# Patient Record
Sex: Female | Born: 1975 | Race: Black or African American | Hispanic: No | Marital: Single | Smoking: Never smoker
Health system: Southern US, Community
[De-identification: ages and names within clinical notes are randomized; demographics above are authoritative.]

## PROBLEM LIST (undated history)

## (undated) DIAGNOSIS — M1990 Unspecified osteoarthritis, unspecified site: Secondary | ICD-10-CM

## (undated) DIAGNOSIS — I10 Essential (primary) hypertension: Secondary | ICD-10-CM

## (undated) DIAGNOSIS — K219 Gastro-esophageal reflux disease without esophagitis: Secondary | ICD-10-CM

---

## 1997-04-16 ENCOUNTER — Inpatient Hospital Stay (HOSPITAL_COMMUNITY): Admission: AD | Admit: 1997-04-16 | Discharge: 1997-04-16 | Payer: Self-pay | Admitting: *Deleted

## 1997-07-28 ENCOUNTER — Ambulatory Visit (HOSPITAL_COMMUNITY): Admission: RE | Admit: 1997-07-28 | Discharge: 1997-07-28 | Payer: Self-pay | Admitting: *Deleted

## 1997-08-22 ENCOUNTER — Inpatient Hospital Stay (HOSPITAL_COMMUNITY): Admission: AD | Admit: 1997-08-22 | Discharge: 1997-08-22 | Payer: Self-pay | Admitting: Obstetrics

## 1997-11-09 ENCOUNTER — Inpatient Hospital Stay (HOSPITAL_COMMUNITY): Admission: AD | Admit: 1997-11-09 | Discharge: 1997-11-09 | Payer: Self-pay | Admitting: *Deleted

## 1997-11-20 ENCOUNTER — Inpatient Hospital Stay (HOSPITAL_COMMUNITY): Admission: AD | Admit: 1997-11-20 | Discharge: 1997-11-24 | Payer: Self-pay | Admitting: Obstetrics & Gynecology

## 1998-06-04 ENCOUNTER — Emergency Department (HOSPITAL_COMMUNITY): Admission: EM | Admit: 1998-06-04 | Discharge: 1998-06-04 | Payer: Self-pay | Admitting: Emergency Medicine

## 1998-06-05 ENCOUNTER — Encounter: Payer: Self-pay | Admitting: Emergency Medicine

## 1998-06-15 ENCOUNTER — Emergency Department (HOSPITAL_COMMUNITY): Admission: EM | Admit: 1998-06-15 | Discharge: 1998-06-15 | Payer: Self-pay | Admitting: Emergency Medicine

## 1999-12-01 ENCOUNTER — Emergency Department (HOSPITAL_COMMUNITY): Admission: EM | Admit: 1999-12-01 | Discharge: 1999-12-01 | Payer: Self-pay | Admitting: *Deleted

## 2000-09-13 ENCOUNTER — Encounter (INDEPENDENT_AMBULATORY_CARE_PROVIDER_SITE_OTHER): Payer: Self-pay

## 2000-09-13 ENCOUNTER — Inpatient Hospital Stay (HOSPITAL_COMMUNITY): Admission: AD | Admit: 2000-09-13 | Discharge: 2000-09-22 | Payer: Self-pay | Admitting: Obstetrics

## 2000-09-13 ENCOUNTER — Encounter: Payer: Self-pay | Admitting: Obstetrics

## 2000-09-17 ENCOUNTER — Encounter: Payer: Self-pay | Admitting: Obstetrics

## 2000-09-17 ENCOUNTER — Encounter: Payer: Self-pay | Admitting: Obstetrics & Gynecology

## 2000-09-24 ENCOUNTER — Inpatient Hospital Stay (HOSPITAL_COMMUNITY): Admission: AD | Admit: 2000-09-24 | Discharge: 2000-09-24 | Payer: Self-pay | Admitting: Obstetrics & Gynecology

## 2000-09-29 ENCOUNTER — Inpatient Hospital Stay (HOSPITAL_COMMUNITY): Admission: AD | Admit: 2000-09-29 | Discharge: 2000-09-29 | Payer: Self-pay | Admitting: *Deleted

## 2000-10-14 ENCOUNTER — Encounter: Admission: RE | Admit: 2000-10-14 | Discharge: 2000-10-14 | Payer: Self-pay | Admitting: Family Medicine

## 2000-11-05 ENCOUNTER — Encounter: Admission: RE | Admit: 2000-11-05 | Discharge: 2000-11-05 | Payer: Self-pay | Admitting: Family Medicine

## 2000-11-18 ENCOUNTER — Encounter: Admission: RE | Admit: 2000-11-18 | Discharge: 2000-11-18 | Payer: Self-pay | Admitting: Family Medicine

## 2003-04-13 ENCOUNTER — Emergency Department (HOSPITAL_COMMUNITY): Admission: EM | Admit: 2003-04-13 | Discharge: 2003-04-13 | Payer: Self-pay | Admitting: Emergency Medicine

## 2003-06-08 ENCOUNTER — Encounter: Admission: RE | Admit: 2003-06-08 | Discharge: 2003-06-08 | Payer: Self-pay | Admitting: Obstetrics and Gynecology

## 2003-08-29 ENCOUNTER — Encounter: Admission: RE | Admit: 2003-08-29 | Discharge: 2003-08-29 | Payer: Self-pay | Admitting: Obstetrics and Gynecology

## 2003-09-18 ENCOUNTER — Ambulatory Visit (HOSPITAL_COMMUNITY): Admission: RE | Admit: 2003-09-18 | Discharge: 2003-09-18 | Payer: Self-pay | Admitting: Obstetrics and Gynecology

## 2003-09-18 ENCOUNTER — Encounter (INDEPENDENT_AMBULATORY_CARE_PROVIDER_SITE_OTHER): Payer: Self-pay | Admitting: Specialist

## 2003-10-12 ENCOUNTER — Ambulatory Visit: Payer: Self-pay | Admitting: Obstetrics and Gynecology

## 2004-05-15 ENCOUNTER — Ambulatory Visit: Payer: Self-pay | Admitting: Internal Medicine

## 2004-08-01 ENCOUNTER — Encounter (INDEPENDENT_AMBULATORY_CARE_PROVIDER_SITE_OTHER): Payer: Self-pay | Admitting: *Deleted

## 2004-08-19 ENCOUNTER — Ambulatory Visit: Payer: Self-pay | Admitting: Family Medicine

## 2004-08-20 ENCOUNTER — Ambulatory Visit: Payer: Self-pay | Admitting: *Deleted

## 2005-07-04 ENCOUNTER — Ambulatory Visit: Payer: Self-pay | Admitting: Nurse Practitioner

## 2006-03-27 ENCOUNTER — Encounter (INDEPENDENT_AMBULATORY_CARE_PROVIDER_SITE_OTHER): Payer: Self-pay | Admitting: *Deleted

## 2007-02-24 ENCOUNTER — Encounter: Payer: Self-pay | Admitting: Obstetrics and Gynecology

## 2007-02-24 ENCOUNTER — Ambulatory Visit: Payer: Self-pay | Admitting: Obstetrics and Gynecology

## 2007-05-26 ENCOUNTER — Ambulatory Visit (HOSPITAL_COMMUNITY): Admission: RE | Admit: 2007-05-26 | Discharge: 2007-05-26 | Payer: Self-pay | Admitting: Family Medicine

## 2007-05-28 ENCOUNTER — Ambulatory Visit: Payer: Self-pay | Admitting: Obstetrics & Gynecology

## 2007-06-10 ENCOUNTER — Ambulatory Visit: Payer: Self-pay | Admitting: Obstetrics & Gynecology

## 2007-06-11 ENCOUNTER — Ambulatory Visit: Payer: Self-pay | Admitting: Obstetrics and Gynecology

## 2007-06-22 ENCOUNTER — Ambulatory Visit (HOSPITAL_COMMUNITY): Admission: RE | Admit: 2007-06-22 | Discharge: 2007-06-22 | Payer: Self-pay | Admitting: Family Medicine

## 2007-07-01 ENCOUNTER — Ambulatory Visit: Payer: Self-pay | Admitting: *Deleted

## 2007-07-07 ENCOUNTER — Ambulatory Visit (HOSPITAL_COMMUNITY): Admission: RE | Admit: 2007-07-07 | Discharge: 2007-07-07 | Payer: Self-pay | Admitting: Family Medicine

## 2007-07-29 ENCOUNTER — Ambulatory Visit: Payer: Self-pay | Admitting: Obstetrics & Gynecology

## 2007-08-04 ENCOUNTER — Ambulatory Visit (HOSPITAL_COMMUNITY): Admission: RE | Admit: 2007-08-04 | Discharge: 2007-08-04 | Payer: Self-pay | Admitting: Family Medicine

## 2007-08-12 ENCOUNTER — Ambulatory Visit: Payer: Self-pay | Admitting: Obstetrics & Gynecology

## 2007-08-16 ENCOUNTER — Ambulatory Visit: Payer: Self-pay | Admitting: Obstetrics & Gynecology

## 2007-08-26 ENCOUNTER — Ambulatory Visit: Payer: Self-pay | Admitting: Obstetrics & Gynecology

## 2007-09-09 ENCOUNTER — Ambulatory Visit: Payer: Self-pay | Admitting: Family Medicine

## 2007-09-15 ENCOUNTER — Ambulatory Visit (HOSPITAL_COMMUNITY): Admission: RE | Admit: 2007-09-15 | Discharge: 2007-09-15 | Payer: Self-pay | Admitting: Family Medicine

## 2007-09-30 ENCOUNTER — Ambulatory Visit: Payer: Self-pay | Admitting: Obstetrics & Gynecology

## 2007-09-30 ENCOUNTER — Inpatient Hospital Stay (HOSPITAL_COMMUNITY): Admission: AD | Admit: 2007-09-30 | Discharge: 2007-09-30 | Payer: Self-pay | Admitting: Obstetrics & Gynecology

## 2007-10-07 ENCOUNTER — Ambulatory Visit: Payer: Self-pay | Admitting: Obstetrics & Gynecology

## 2007-10-11 ENCOUNTER — Ambulatory Visit: Payer: Self-pay | Admitting: Gynecology

## 2007-10-12 ENCOUNTER — Encounter: Payer: Self-pay | Admitting: *Deleted

## 2007-10-12 ENCOUNTER — Inpatient Hospital Stay (HOSPITAL_COMMUNITY): Admission: AD | Admit: 2007-10-12 | Discharge: 2007-10-12 | Payer: Self-pay | Admitting: Gynecology

## 2007-10-12 ENCOUNTER — Ambulatory Visit: Payer: Self-pay | Admitting: Family Medicine

## 2007-10-14 ENCOUNTER — Ambulatory Visit: Payer: Self-pay | Admitting: Obstetrics & Gynecology

## 2007-10-18 ENCOUNTER — Ambulatory Visit: Payer: Self-pay | Admitting: Family Medicine

## 2007-10-21 ENCOUNTER — Ambulatory Visit: Payer: Self-pay | Admitting: Obstetrics & Gynecology

## 2007-10-25 ENCOUNTER — Ambulatory Visit: Payer: Self-pay | Admitting: Gynecology

## 2007-10-28 ENCOUNTER — Ambulatory Visit: Payer: Self-pay | Admitting: Obstetrics & Gynecology

## 2007-11-01 ENCOUNTER — Ambulatory Visit: Payer: Self-pay | Admitting: Obstetrics & Gynecology

## 2007-11-01 ENCOUNTER — Ambulatory Visit (HOSPITAL_COMMUNITY): Admission: RE | Admit: 2007-11-01 | Discharge: 2007-11-01 | Payer: Self-pay | Admitting: Family Medicine

## 2007-11-04 ENCOUNTER — Ambulatory Visit: Payer: Self-pay | Admitting: Obstetrics & Gynecology

## 2007-11-08 ENCOUNTER — Ambulatory Visit: Payer: Self-pay | Admitting: Obstetrics & Gynecology

## 2007-11-11 ENCOUNTER — Ambulatory Visit: Payer: Self-pay | Admitting: Obstetrics & Gynecology

## 2007-11-12 ENCOUNTER — Ambulatory Visit: Payer: Self-pay | Admitting: Family Medicine

## 2007-11-12 ENCOUNTER — Inpatient Hospital Stay (HOSPITAL_COMMUNITY): Admission: RE | Admit: 2007-11-12 | Discharge: 2007-11-15 | Payer: Self-pay | Admitting: Family Medicine

## 2007-11-12 ENCOUNTER — Encounter: Payer: Self-pay | Admitting: Family Medicine

## 2007-11-27 ENCOUNTER — Inpatient Hospital Stay (HOSPITAL_COMMUNITY): Admission: AD | Admit: 2007-11-27 | Discharge: 2007-11-27 | Payer: Self-pay | Admitting: Family Medicine

## 2008-07-26 ENCOUNTER — Emergency Department (HOSPITAL_COMMUNITY): Admission: EM | Admit: 2008-07-26 | Discharge: 2008-07-26 | Payer: Self-pay | Admitting: Emergency Medicine

## 2009-03-30 ENCOUNTER — Emergency Department (HOSPITAL_COMMUNITY): Admission: EM | Admit: 2009-03-30 | Discharge: 2009-03-31 | Payer: Self-pay | Admitting: Emergency Medicine

## 2009-03-31 ENCOUNTER — Emergency Department (HOSPITAL_COMMUNITY): Admission: EM | Admit: 2009-03-31 | Discharge: 2009-03-31 | Payer: Self-pay | Admitting: Emergency Medicine

## 2009-12-04 IMAGING — US US OB DETAIL+14 WK
1 series · 18 of 28 positions shown · non-contrast
Comparison: none

OBSTETRICAL ULTRASOUND:
 This ultrasound was performed in The [HOSPITAL], and the [facility] report will be stored to [REDACTED] PACS.

[Series 1: us ob detail+14 wk · 89 acquisitions, 18 frames shown]
[im 1/89]
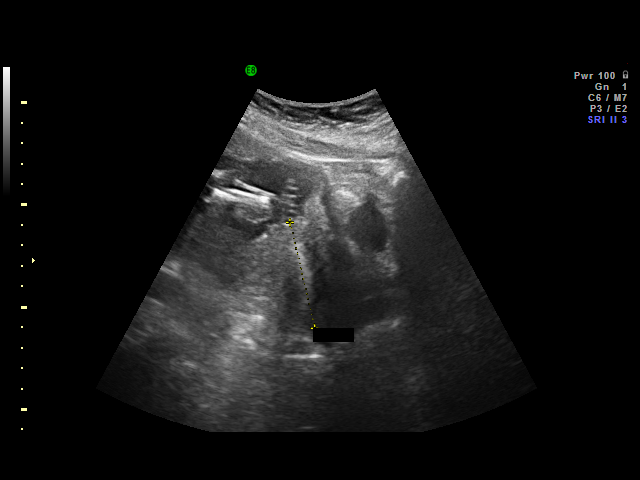
[im 7/89]
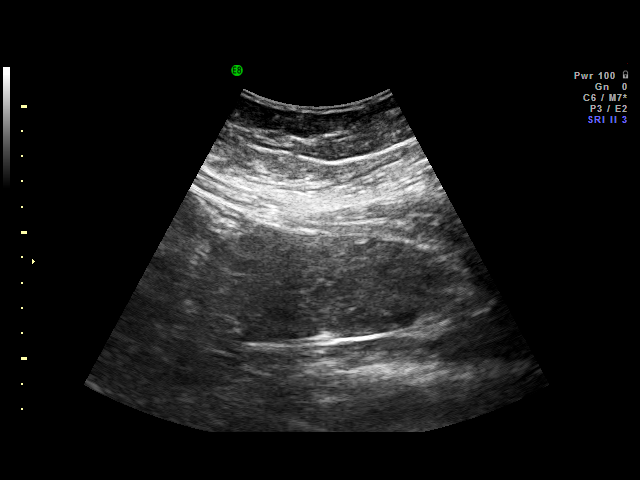
[im 10/89]
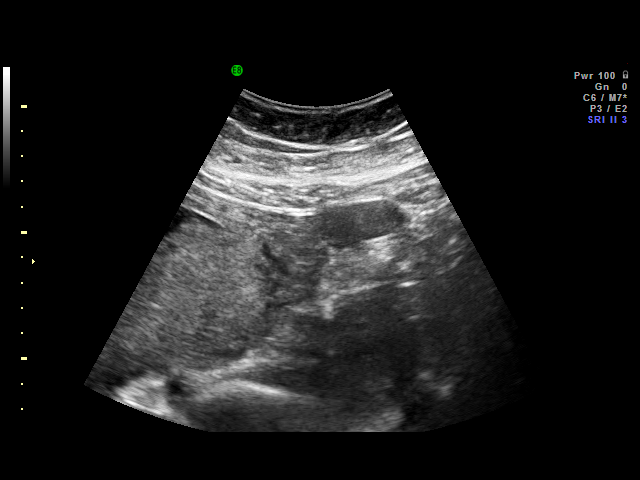
[im 17/89]
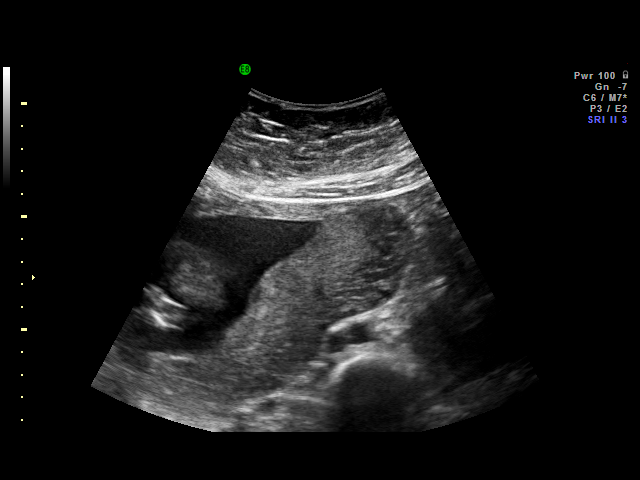
[im 23/89]
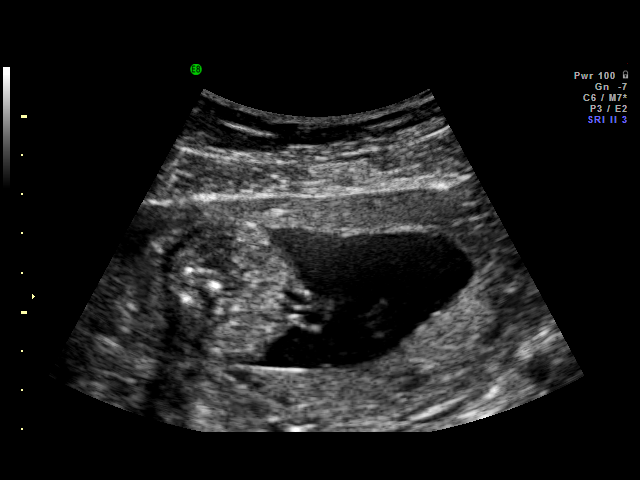
[im 27/89]
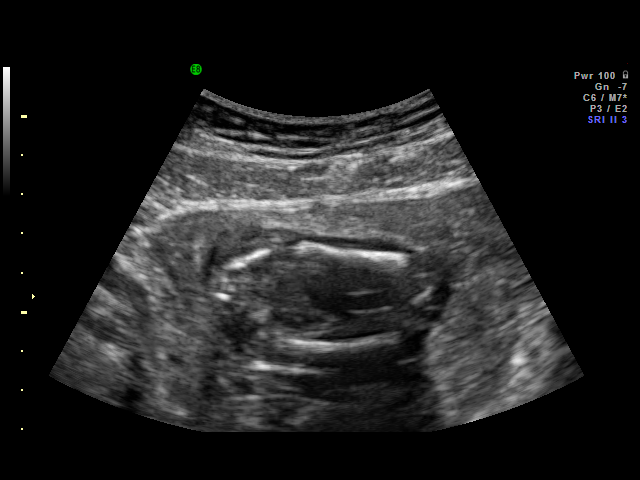
[im 33/89]
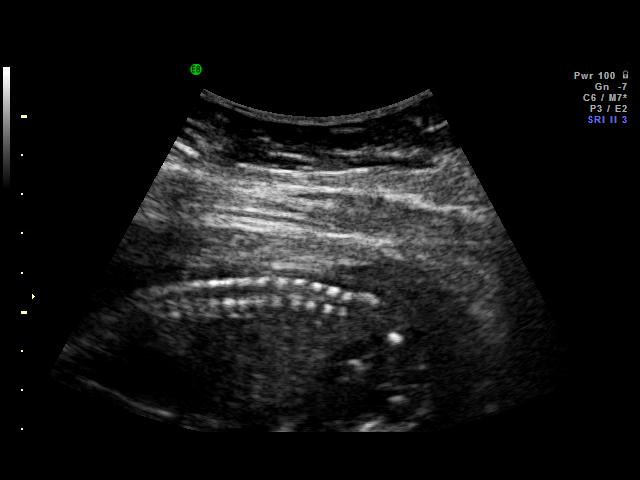
[im 36/89]
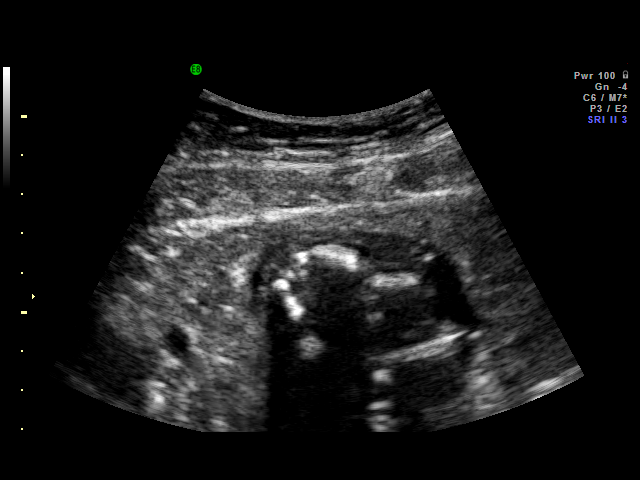
[im 43/89]
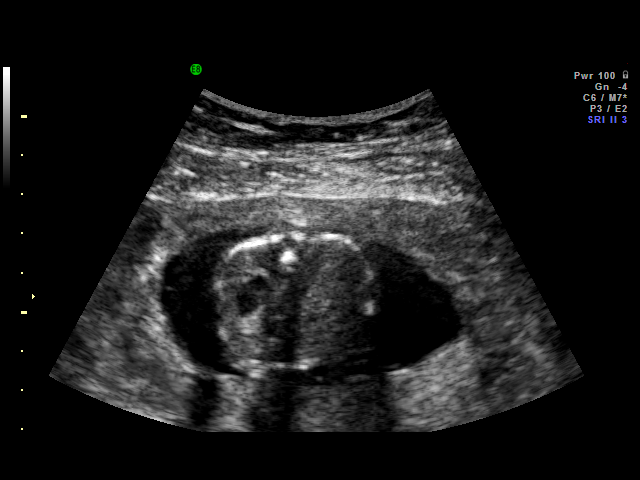
[im 46/89]
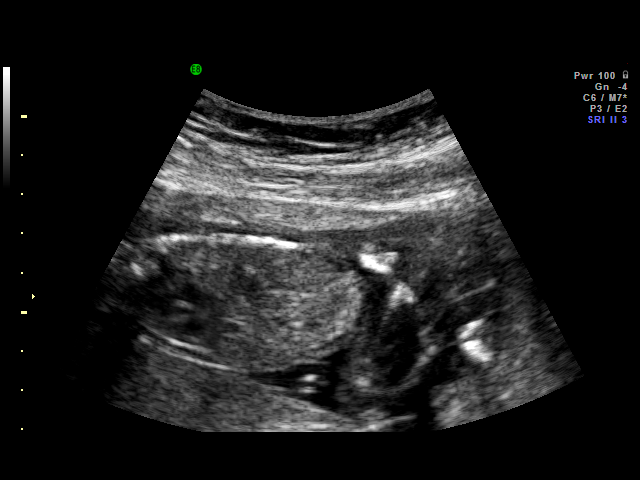
[im 53/89]
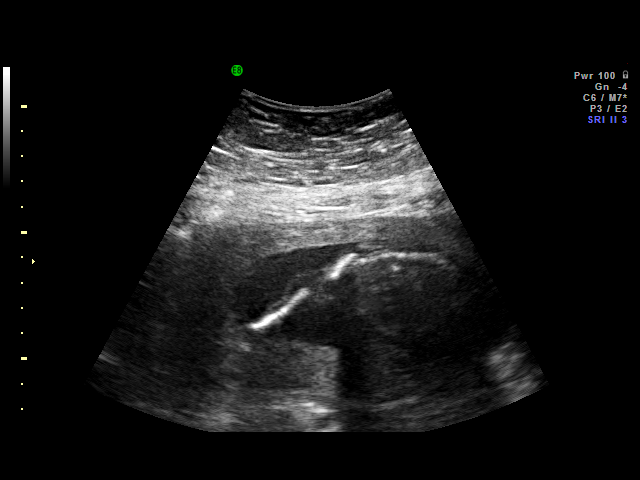
[im 56/89]
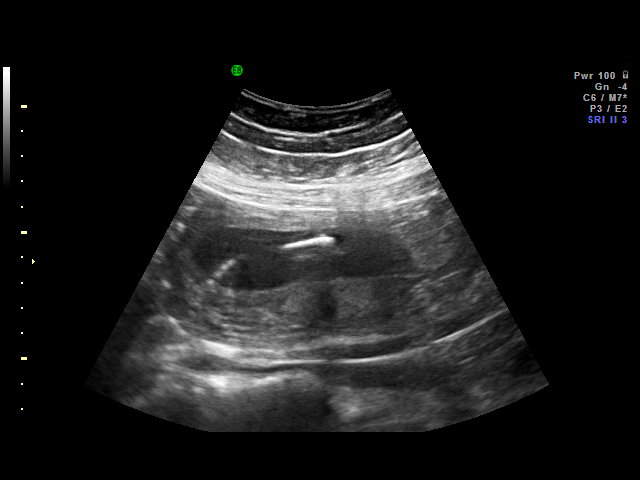
[im 62/89]
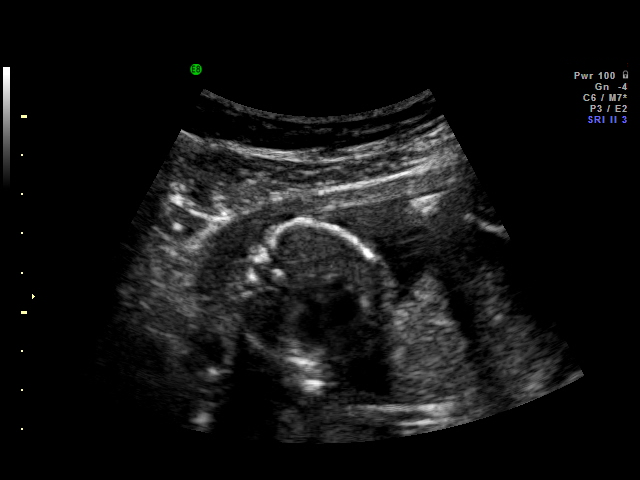
[im 69/89]
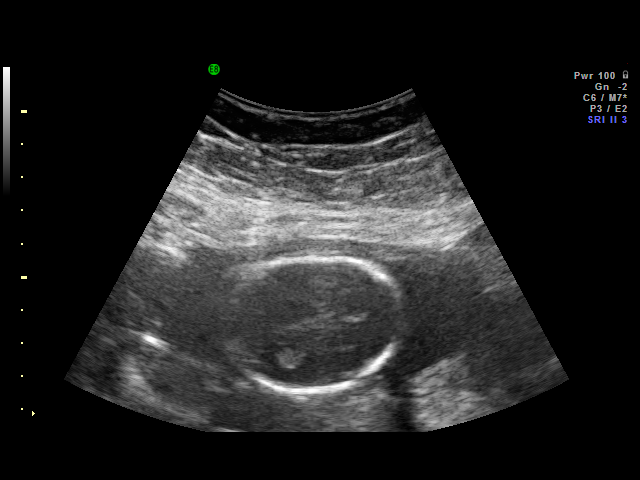
[im 72/89]
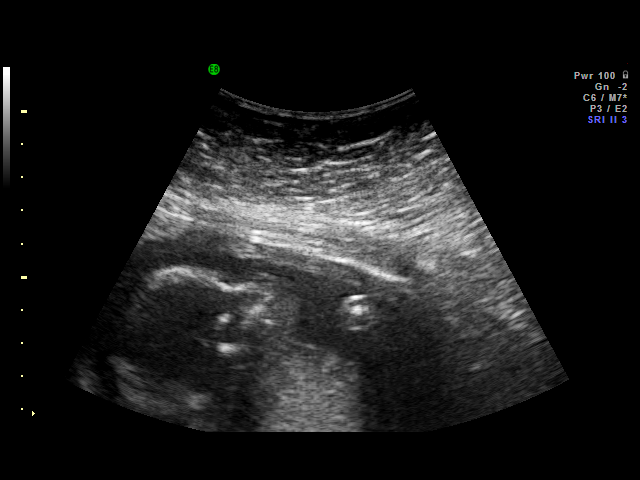
[im 79/89]
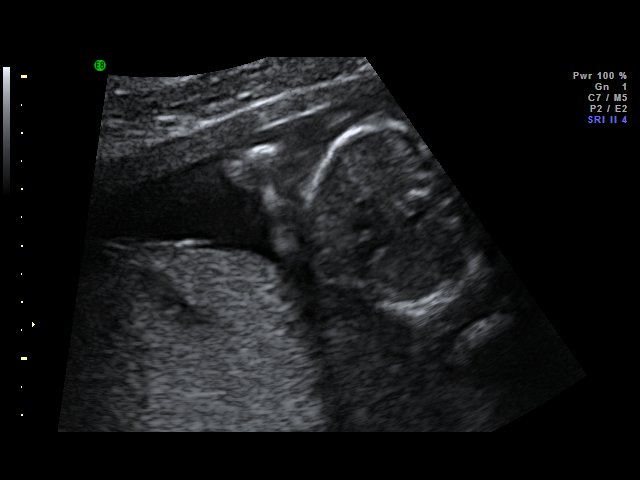
[im 82/89]
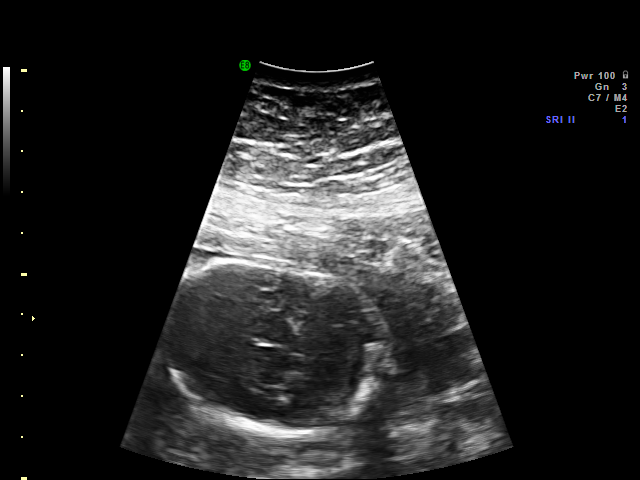
[im 89/89]
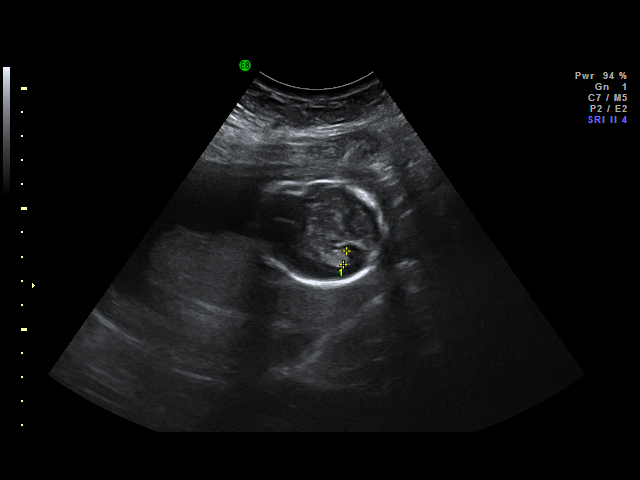

[18 of 28 positions shown; findings below may reference images not displayed]

IMPRESSION: [facility] has also been faxed to the ordering physician.

## 2010-02-17 ENCOUNTER — Encounter: Payer: Self-pay | Admitting: *Deleted

## 2010-03-11 IMAGING — US US OB FOLLOW-UP
1 series · 14 of 26 positions shown · non-contrast
Comparison: none

OBSTETRICAL ULTRASOUND:
 This ultrasound was performed in The [facility]OSP[facility]], and the [facility] report will be stored to [REDACTED] PACS.

[Series 1: us ob follow-up · 14 of 26 slices shown]
[im 1/26]
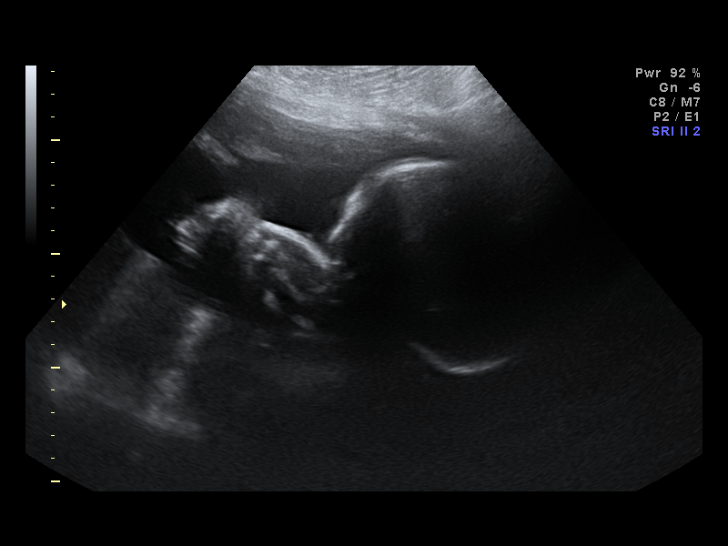
[im 3/26]
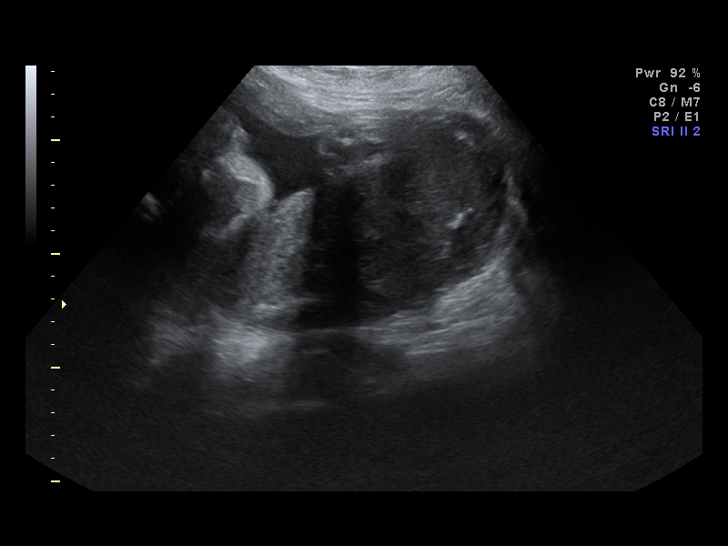
[im 5/26]
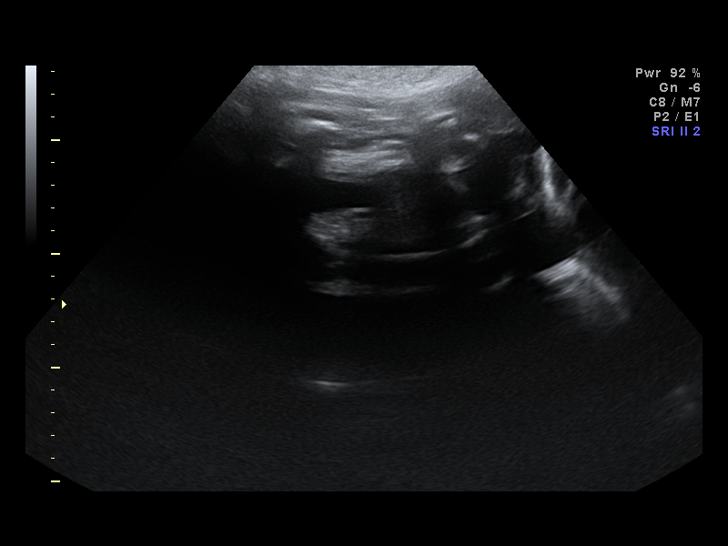
[im 7/26]
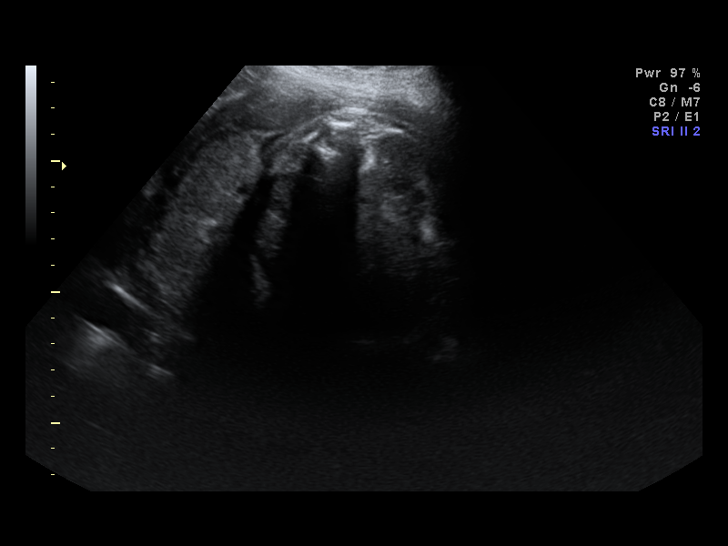
[im 9/26]
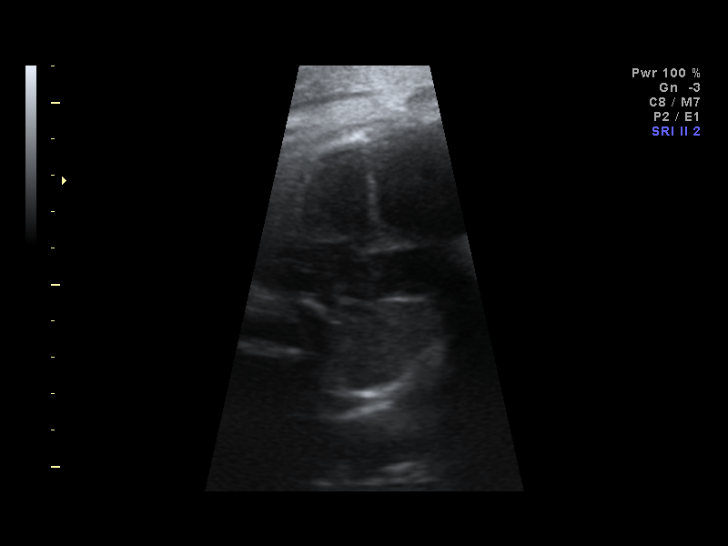
[im 11/26]
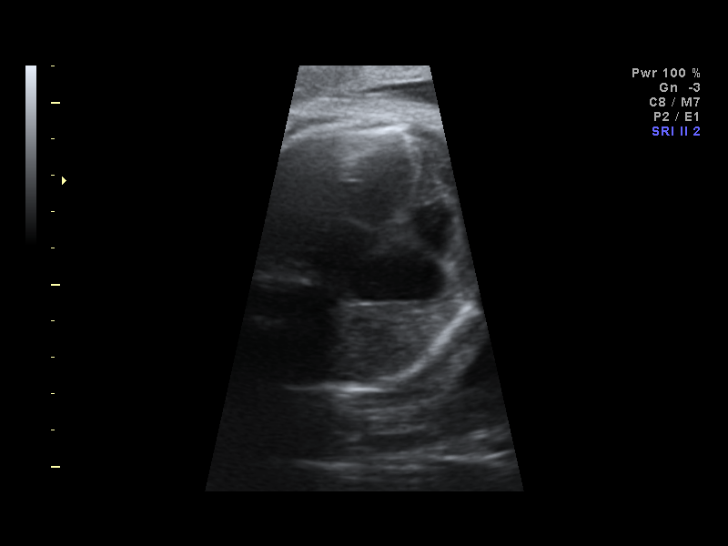
[im 13/26]
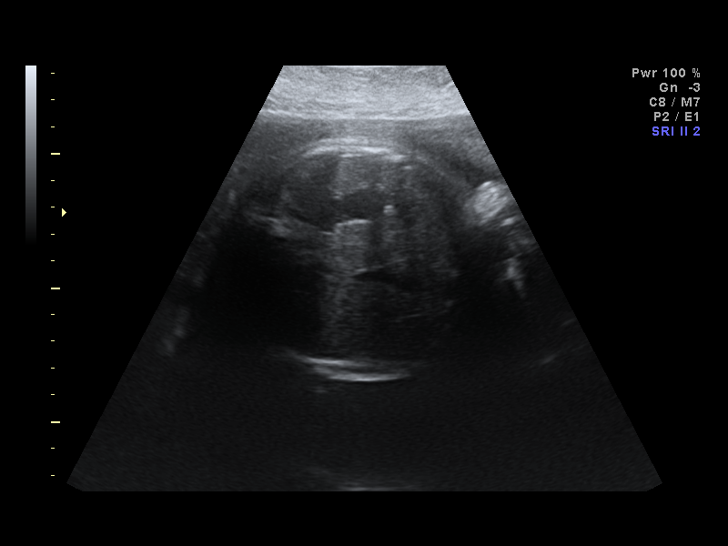
[im 14/26]
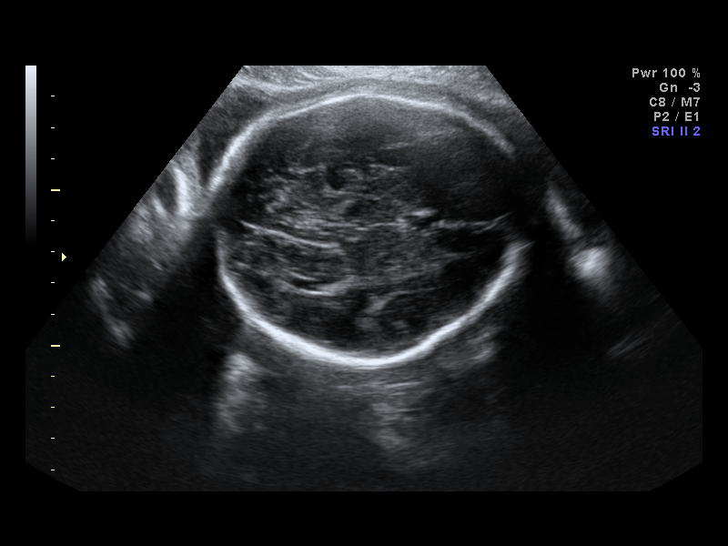
[im 16/26]
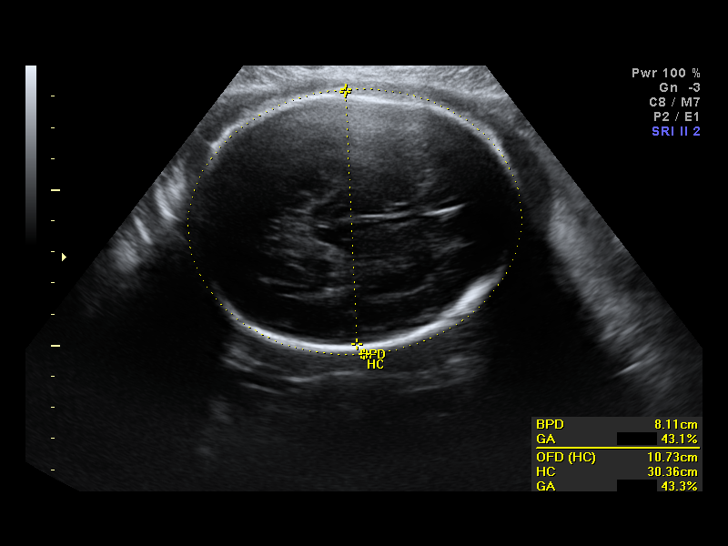
[im 18/26]
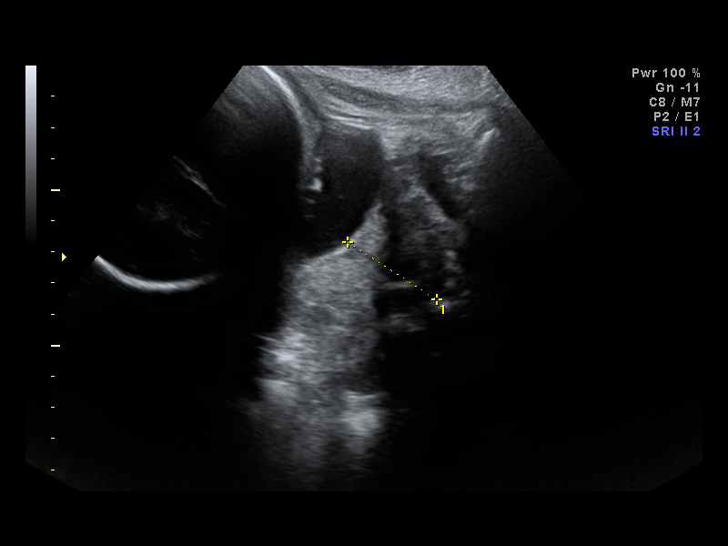
[im 20/26]
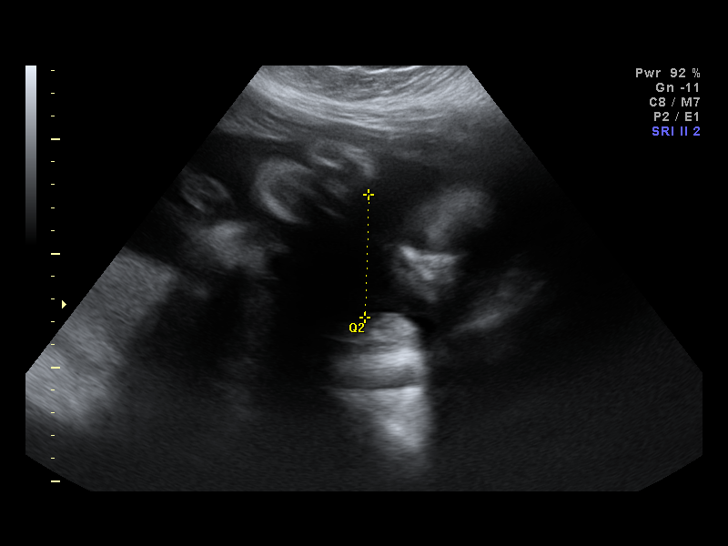
[im 22/26]
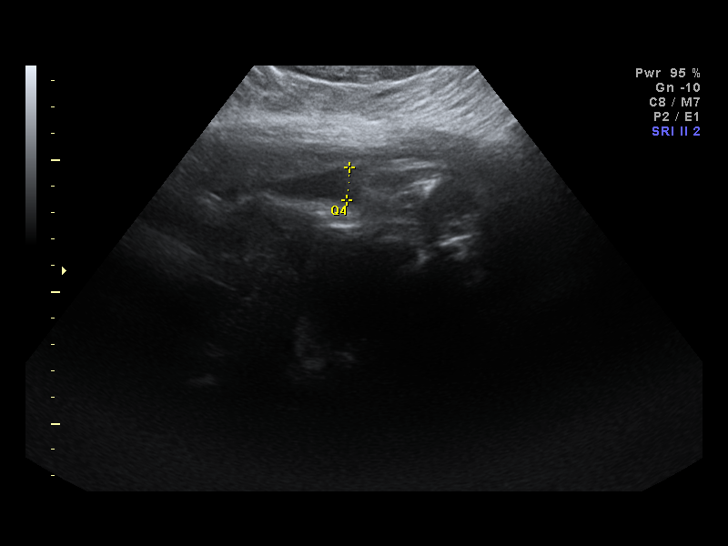
[im 24/26]
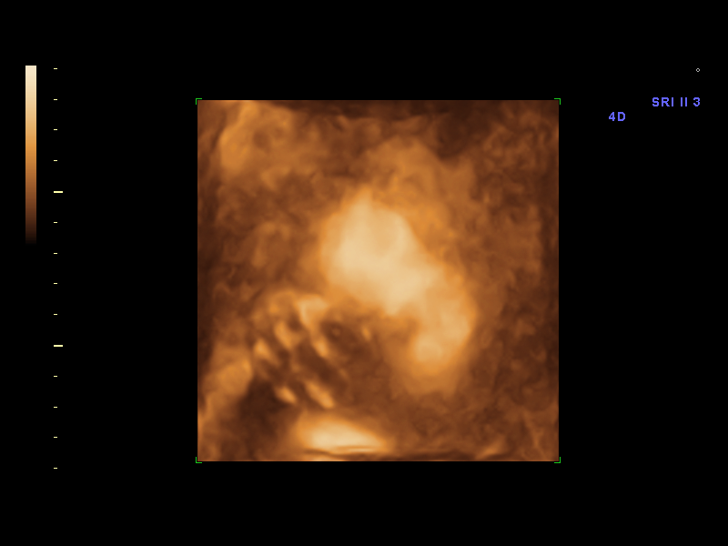
[im 26/26]
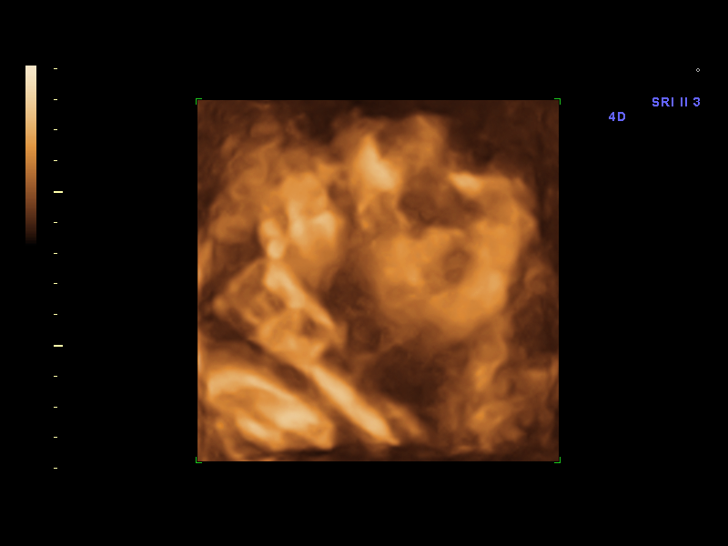

[14 of 26 positions shown; findings below may reference images not displayed]

IMPRESSION: [facility] has also been faxed to the ordering physician.

## 2010-06-11 NOTE — Op Note (Signed)
NAME:  Courtney Cortez, Courtney Cortez                 ACCOUNT NO.:  [id number]   MEDICAL RECORD NO.:  [id number]          PATIENT TYPE:  INP   LOCATION:  9372                          FACILITY:  [facility]   PHYSICIAN:  Tanya S. Shawnie Pons, M.D.   DATE OF BIRTH:  1975-03-28   DATE OF PROCEDURE:  11/12/2007  DATE OF DISCHARGE:                               OPERATIVE REPORT   PREOPERATIVE DIAGNOSES:  Intrauterine pregnancy at 37 weeks, previous  cesarean section x2, and chronic hypertension with superimposed  preeclampsia.   DISCHARGE DIAGNOSES:  Intrauterine pregnancy at 37 weeks, previous  cesarean section x2, and chronic hypertension with superimposed pre-  eclampsia.   PROCEDURE:  Repeat low transverse cesarean section.   SURGEON:  Shelbie Proctor. Shawnie Pons, MD   ASSISTANT:  Javier Glazier. Okey Dupre, MD   ANESTHESIA:  Spinal, local, Raul Del, MD.   FINDINGS:  Viable female infant, Apgars 8 and 9, weight 6 pounds 12  ounces.   SPECIMENS:  Placenta to pathology.   ESTIMATED BLOOD LOSS:  1000 mL.   COMPLICATIONS:  None known.   REASON FOR PROCEDURE:  Briefly, the patient is a 35 year old, gravida 4,  para 2-1-0-3, who has a history of chronic hypertension.  She has been  followed in our clinic and had a baseline 24-hour urine of 206 mg.  On  October 18, 2007, she was up to 610 mg by urine dip with elevated  blood pressures.  Her case was reviewed MSN who agreed with the delivery  at 37 weeks.  The patient had previously VBAC x1 and then had a  subsequent C-section and desired C-section for this pregnancy.   PROCEDURE:  The patient was taken to the OR where she was prepped and  draped in the usual sterile fashion.  A Foley catheter was placed inside  her bladder.  When the anesthesia was felt to be adequate, I proceeded  to make a Pfannenstiel incision through the old scar.  This incision  carried down to the underlying fascia with electrocautery used to  control bleeding.  The fascia was divided in the midline,  and the  fascial incision extended laterally sharply.  Edges of the superior and  inferior portions of the fascia were grasped with Kocher clamps and  dissected off the underlying rectus muscle bluntly laterally and with  the cautery in the midline.  Attempt was made to divide the rectus  muscles; however, these were densely adherent to each other.  The  peritoneal cavity was finally entered high with a strong band of  peritoneum that was densely adherent to the underlying rectus and had to  be taken down with sharp dissection laterally on the inferior portion of  the incision.  A bladder flap was then created.  The bladder was not  exceptionally high on the uterus, but the lower uterine segment was  relatively thick, but an adequate uterine incision was made in the lower  uterine segment.  It was carried down to the underlying amniotic cavity  in the midline, and then, the incision extended bluntly laterally.  The  AROM was  accomplished with an Allis clamp, and clear fluid was noted.  The infant was in a vertex presentation; however, it was occiput  transverse.  Attempt was made to deliver the infant; however, could not  be delivered even by vacuum until the rectus was taken down on the  patient's right side.  After this, the baby was delivered easily.  There  was a spontaneous cry on the abdomen.  The cord was clamped x2, cut, and  the infant given to awaiting peds.  Placenta was delivered after this  and given to cord blood donation.  The uterine cavity was cleaned out  with a dry lap pad.  Edges of the uterine incision were grasped with  ring forceps.  The uterine incision closed with a 0 Vicryl suture in a  locked running fashion.  A figure-of-eight was used at the patient's  left corner of the uterine incision to achieve hemostasis.  The uterine  incision was looked at twice by myself and Dr. Okey Dupre and felt to be  adequately hemostatic.  Peritoneum was grasped with Kelly clamps and   closed with a 0 Vicryl suture in a running fashion.  The fascia was  closed with 0 Vicryl also running.  Subcutaneous tissue was irrigated,  any bleeders were cauterized, and the skin closed using clips.  A 30 mL  of 0.25% of Marcaine were injected about the incision.  All instrument  and lap counts were correct x2.  The patient was awakened and taken to  recovery room in stable condition.       Shelbie Proctor. Shawnie Pons, M.D.  Electronically Signed     TSP/MEDQ  D:  11/12/2007  T:  11/13/2007  Job:  [id number]

## 2010-06-11 NOTE — Group Therapy Note (Signed)
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 Has been getting regular Pap  smears.  Last Pap smear she got apparently they got no endocervical  cells although the rest of the Pap smear was negative.  We repeated the  Pap smear today with endocervical brushings which I expect should be  adequate.  I told the patient if her Pap smear is normal this time, she  would not have to come back for 1 year.           ______________________________  Argentina Donovan, MD     PR/MEDQ  D:  02/24/2007  T:  02/25/2007  Job:  [id number]

## 2010-06-11 NOTE — Discharge Summary (Signed)
NAME:  Courtney Cortez, Courtney Cortez                 ACCOUNT NO.:  [id number]   MEDICAL RECORD NO.:  [id number]          PATIENT TYPE:  INP   LOCATION:  9127                          [facility]ACILITY:  [facility]   PHYSICIAN:  Allie Bossier, MD        DATE OF BIRTH:  07-22-1975   DATE OF ADMISSION:  11/12/2007  DATE OF DISCHARGE:  11/15/2007                               DISCHARGE SUMMARY   REASON FOR ADMISSION:  Ms. Courtney Cortez is a 35 year old gravida 4, para  2-1-0-3 who was admitted at 59 weeks' gestational age diagnosis of mild  preeclampsia for an elective repeat low transverse cesarean section.  The patient refused VBAC and tubal ligation procedures.  The patient was  counseled on the risks and benefits of repeat cesarean section to  include but not limited to bleeding, infection and damage to intra-  abdominal organs.  The patient understands these risks and desires to  proceed with low-transverse cesarean section.   HOSPITAL COURSE:  The patient was admitted and a repeat low transverse  cesarean section was performed.  For details of procedure, please see  Dr. Bertram Denver dictation.  Postoperatively, the patient was admitted to  the Intensive Care Unit and received magnesium sulfate for 24 hours.  Upon completion of her 24 hours magnesium sulfate, she was transferred  to the ward where her postpartum course remained unremarkable.  Her  blood pressures during her postpartum course were in the range of 130s-  140s/80s-90s.  Her postpartum hemoglobin and hematocrit was 8.7 and  26.3.  This is a decrease from 10.6 and 32.8 upon admission.  At the  time of discharge, she is in good condition with minimal pain.  She is  ambulating well, tolerating regular diet and passing flatus.  She will  be discharged to home.  She will have her staples removed by the baby  lab nurse between postoperative day #5 and 7.  She will resume her  prepregnancy blood pressure medications which is hydrochlorothiazide 25  mg daily.  She is  also given ibuprofen and Percocet for pain.  She is  instructed to [facility] weeks as well as  return to her primary care Courtney Cortez for reevaluation of her blood  pressure control.   FINAL DIAGNOSES:  1. Term intrauterine pregnancy.  2. Mild preeclampsia.  3. History of previous cesarean section.   PROCEDURE PERFORMED:  Repeat low transverse cesarean section.   CONDITION ON DISCHARGE:  Good.   INSTRUCTION FOR DISCHARGE:  As per hospital course.  All of the above  recommendations and instructions were discussed with the patient who  voiced understanding.  All the patient's questions were answered.      Odie Sera, DO  Electronically Signed     ______________________________  Allie Bossier, MD    MC/MEDQ  D:  11/15/2007  T:  11/15/2007  Job:  [id number]

## 2010-06-14 NOTE — Op Note (Signed)
NAME:  Courtney Cortez, Courtney Cortez                           ACCOUNT NO.:  [id number]   MEDICAL RECORD NO.:  [id number]                   PATIENT TYPE:  AMB   LOCATION:  SDC                                  [facility]ACILITY:  [facility]   PHYSICIAN:  Phil D. Okey Dupre, M.D.                  DATE OF BIRTH:  05/26/1975   DATE OF PROCEDURE:  09/18/2003  DATE OF DISCHARGE:                                 OPERATIVE REPORT   PROCEDURE:  LEEP conization of the cervix, two stage.   PREOPERATIVE DIAGNOSES:  Severe cervical dysplasia with positive  endocervical curettage.   POSTOPERATIVE DIAGNOSES:  Pending pathology report.   SURGEON:  Javier Glazier. Rose, M.D.   ESTIMATED BLOOD LOSS:  Less than 5 mL.   ANESTHESIA:  MAC with local.   SPECIMENS:  Cervical biopsies.   DESCRIPTION OF PROCEDURE:  The patient was scheduled in the operating room  because in the clinic it could not be because of the position of the cervix  pointing almost directly posterior, we could not get a speculum in and the  patient relaxed enough to be able to center the cervix in the center of the  speculum in order to do an adequate biopsy without risk of burning the  patient. The patient was placed in the dorsal lithotomy position. A large  Grave's insulated speculum was placed in the cervix and manipulated until  the cervix was able to be centered in the center of the speculum.  The  lateral speculum was also placed in because of redundant vaginal tissue  especially on the patient's right that covered a portion of the cervix even  with the Grave's speculum in. This seemed to correct that problem quite  adequately.  Under MAC sedation, four quadrants at 10, 8, 2 and 4 o'clock  were injected each with 1 mL of 1% Xylocaine with 1:100,000 epinephrine.  Using a 2 x 0.8 cm loop, the transition zone was removed. A 1 x 1 cm second  loop was used to do a biopsy further up and remove a portion of the  endocervix because of the positive ECC and these were sent for  pathological  diagnosis. There was very little bleeding around the operative site, the  entire exocervix for about a 1/2 cm outside of the biopsy site was  coagulated with hot cautery set at 50 watts and then the bleeding site  within the biopsy site was also controlled with the hot cautery.  Monsel  solution was then used as a backup up inside of the biopsy site to assure  hemostasis. The speculum was removed to the vagina and patient transferred  to the recovery room in satisfactory condition having tolerated the  procedure well.  Phil D. Okey Dupre, M.D.    PDR/MEDQ  D:  09/18/2003  T:  09/19/2003  Job:  [id number]

## 2010-06-14 NOTE — Group Therapy Note (Signed)
NAME:  JACOBI, RYANT NO.:  [id number]   MEDICAL RECORD NO.:  [id number]                   PATIENT TYPE:  OUT   LOCATION:  [facility]                           FACILITY:  [facility]   PHYSICIAN:  Argentina Donovan, MD                     DATE OF BIRTH:  [birth year]   DATE OF SERVICE:  08/29/2003                                    CLINIC NOTE   The patient is a 35 year old black female gravida 4, para 3-0-1-3 with a  severe cervical dysplasia CIN II-III with a positive ECC who on examination  had the cervix pointing directly posterior and felt could not be done in the  clinic safely without risking burning the patient so we are going to  schedule her on the operating room for the procedure.  The patient has seen  the film before and has been placed on ibuprofen prior to the procedure.   IMPRESSION:  Severe cervical dysplasia.                                               Argentina Donovan, MD    PR/MEDQ  D:  08/29/2003  T:  08/30/2003  Job:  [id number]

## 2010-06-14 NOTE — Op Note (Signed)
[facility]  Patient:    Courtney Cortez, Courtney Cortez Visit Number: [id number] MRN: [id number]          Service Type: OBS Location: [id number] Attending Physician:  Tammi Sou Proc. Date: 09/18/00 Adm. Date:  09/13/2000                             Operative Report  PREOPERATIVE DIAGNOSES:       1. A 30-week intrauterine pregnancy.                               2. 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                               3. Failed induction.  POSTOPERATIVE DIAGNOSES:      1. A 30-week intrauterine pregnancy.                               2. 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                               3. Failed induction.  PROCEDURE:                    Repeat low transverse cesarean section via Pfannenstiel.  SURGEON:                      Roseanna Rainbow, M.D.  ASSISTANT:                    Ed Blalock. Burnadette Peter, M.D.  ANESTHESIA:                   General endotracheal.  COMPLICATIONS:                None.  ESTIMATED BLOOD LOSS:         800 cc.  FLUIDS AND URINE OUTPUT:      As per anesthesiology.  INDICATIONS:                  The patient is a 35 year old para 2 at 30 weeks with HELLP syndrome who underwent induction.  Maximum dilatation 1 cm.  FINDINGS:                     The infant was in the cephalic presentation. Neonatology was present at delivery.  Apgars were 2 and 7.  Normal uterus, tubes and ovaries.  DESCRIPTION OF PROCEDURE:     The patient was taken to the operating room, where general anesthesia was induced without difficulty.  She had been prepped and draped in the normal sterile fashion in the dorsal supine position with a leftward tilt.  A Pfannenstiel skin incision was then made  with a scalpel through the previous scar and carried through to the underlying layer of fascia  with the Bovie.  The fascia was incised in the midline and the incision extended laterally with Mayo scissors.  The superior aspect of the fascial incision was then grasped with Kocher clamps, elevated and the underlying rectus muscles dissected off.  Attention was then turned to the inferior aspect of this incision, which was manipulated in a similar fashion.  the rectus muscles were separated in the midline.  The parietal peritoneum was identified, tented up and entered sharply with Metzenbaum scissors.  The peritoneal incision was then extended superiorly and inferiorly with good visualization of the bladder.  A bladder blade was then inserted and the vesicouterine peritoneum identified, grasped with pickups and entered sharply with Metzenbaum scissors.  This incision was then extended laterally and a bladder flap created sharply.  The bladder blade was then reinserted and the lower uterine segment incised in a transverse fashion with a scalpel.  The uterine incision was then extended laterally with bandage scissors.  The bladder blade was removed and the infants head delivered atraumatically.  The nose and mouth were suctioned with a bulb suction.  The cord was clamped and cut.  The infant was handed off to the awaiting neonatologist.  Cord gases were sent.  The placenta was then removed.  The uterus was exteriorized and cleared of all clots and debris.  The uterine incision was then repaired with 0 Monocryl in a running lock fashion.  A second layer of the same suture was used to obtain excellent hemostasis.  The uterus was returned to the abdomen. The gutters were cleared of all clots.  The fascia was reapproximated with 0 PDS in a running fashion.  The skin was closed with staples.  The patient tolerated the procedure well.  Sponge, lap, needle and instrument counts were correct x 2.  The patient was taken to the PACU in stable condition. Attending Physician:  Tammi Sou DD:  09/18/00 TD:  09/21/00 Job: [id number]

## 2010-10-23 LAB — POCT URINALYSIS DIP (DEVICE)
Bilirubin Urine: NEGATIVE
Glucose, UA: NEGATIVE
Hgb urine dipstick: NEGATIVE
Specific Gravity, Urine: 1.02

## 2010-10-24 LAB — POCT URINALYSIS DIP (DEVICE)
Bilirubin Urine: NEGATIVE
Bilirubin Urine: NEGATIVE
Glucose, UA: NEGATIVE
Glucose, UA: NEGATIVE
Hgb urine dipstick: NEGATIVE
Ketones, ur: NEGATIVE
Nitrite: NEGATIVE
Nitrite: NEGATIVE
Specific Gravity, Urine: 1.02
Specific Gravity, Urine: 1.015

## 2010-10-25 LAB — POCT URINALYSIS DIP (DEVICE)
Bilirubin Urine: NEGATIVE
Bilirubin Urine: NEGATIVE
Glucose, UA: NEGATIVE
Glucose, UA: NEGATIVE
Hgb urine dipstick: NEGATIVE
Nitrite: NEGATIVE
Nitrite: NEGATIVE

## 2010-10-28 LAB — CBC
HCT: 29.2 — ABNORMAL LOW
HCT: 32.8 — ABNORMAL LOW
Hemoglobin: 9.9 — ABNORMAL LOW
Hemoglobin: 10.6 — ABNORMAL LOW
MCHC: 33
MCV: 90.4
MCV: 90.6
Platelets: 303
RBC: 3.26 — ABNORMAL LOW
RBC: 3.62 — ABNORMAL LOW
RDW: 15.4
WBC: 4.3
WBC: 4.6

## 2010-10-28 LAB — COMPREHENSIVE METABOLIC PANEL
ALT: 9
ALT: 9
AST: 13
Albumin: 2.6 — ABNORMAL LOW
Alkaline Phosphatase: 62
BUN: 3 — ABNORMAL LOW
CO2: 25
CO2: 24
Chloride: 107
Chloride: 103
GFR calc Af Amer: >60
GFR calc non Af Amer: >60
Glucose, Bld: 85
Potassium: 3.4 — ABNORMAL LOW
Potassium: 3.3 — ABNORMAL LOW
Sodium: 132 — ABNORMAL LOW
Total Bilirubin: 0.1 — ABNORMAL LOW
Total Bilirubin: 0.2 — ABNORMAL LOW

## 2010-10-28 LAB — POCT URINALYSIS DIP (DEVICE)
Hgb urine dipstick: NEGATIVE
Hgb urine dipstick: NEGATIVE
Ketones, ur: NEGATIVE
Ketones, ur: NEGATIVE
Ketones, ur: NEGATIVE
Operator id: 159681
Operator id: 297281
Operator id: 194561
Operator id: 194561
Operator id: 200901
Protein, ur: 100 — AB
Protein, ur: 100 — AB
Protein, ur: 100 — AB
Protein, ur: 100 — AB
Protein, ur: 100 — AB
Specific Gravity, Urine: 1.025
Specific Gravity, Urine: 1.015
Specific Gravity, Urine: 1.02
Specific Gravity, Urine: 1.02
Urobilinogen, UA: 1
Urobilinogen, UA: 1
Urobilinogen, UA: 0.2
Urobilinogen, UA: 1
Urobilinogen, UA: 1
pH: 6.5
pH: 7.5
pH: 7
pH: 7
pH: 7

## 2010-10-28 LAB — URINALYSIS, DIPSTICK ONLY
Glucose, UA: NEGATIVE
Ketones, ur: NEGATIVE
Specific Gravity, Urine: 1.02
pH: 6.5

## 2010-10-28 LAB — URIC ACID: Uric Acid, Serum: 3.9

## 2010-10-28 LAB — ABO/RH: ABO/RH(D): O POS

## 2010-10-30 LAB — COMPREHENSIVE METABOLIC PANEL
ALT: 9
AST: 16
Albumin: 2.6 — ABNORMAL LOW
CO2: 24
Calcium: 8.8
Creatinine, Ser: 0.49
GFR calc Af Amer: >60
GFR calc non Af Amer: >60
Sodium: 135

## 2010-10-30 LAB — CBC
MCHC: 33.1
MCV: 90
Platelets: 313
RBC: 3.31 — ABNORMAL LOW
WBC: 4.4

## 2010-10-30 LAB — POCT URINALYSIS DIP (DEVICE)
Bilirubin Urine: NEGATIVE
Glucose, UA: NEGATIVE
Glucose, UA: NEGATIVE
Hgb urine dipstick: NEGATIVE
Hgb urine dipstick: NEGATIVE
Ketones, ur: NEGATIVE
Nitrite: NEGATIVE
Operator id: 200901
Protein, ur: 100 — AB
Specific Gravity, Urine: 1.02
Urobilinogen, UA: 1
pH: 7

## 2010-12-28 DIAGNOSIS — R1013 Epigastric pain: Secondary | ICD-10-CM | POA: Insufficient documentation

## 2010-12-28 DIAGNOSIS — K219 Gastro-esophageal reflux disease without esophagitis: Secondary | ICD-10-CM | POA: Insufficient documentation

## 2010-12-28 DIAGNOSIS — R112 Nausea with vomiting, unspecified: Secondary | ICD-10-CM | POA: Insufficient documentation

## 2010-12-28 DIAGNOSIS — I10 Essential (primary) hypertension: Secondary | ICD-10-CM | POA: Insufficient documentation

## 2010-12-29 ENCOUNTER — Emergency Department (HOSPITAL_COMMUNITY)
Admission: EM | Admit: 2010-12-29 | Discharge: 2010-12-29 | Disposition: A | Discharge status: Home / Self Care | Payer: Self-pay | Attending: Emergency Medicine | Admitting: Emergency Medicine

## 2010-12-29 ENCOUNTER — Emergency Department (HOSPITAL_COMMUNITY): Payer: Self-pay

## 2010-12-29 ENCOUNTER — Encounter: Payer: Self-pay | Admitting: *Deleted

## 2010-12-29 DIAGNOSIS — K219 Gastro-esophageal reflux disease without esophagitis: Secondary | ICD-10-CM

## 2010-12-29 DIAGNOSIS — R1013 Epigastric pain: Secondary | ICD-10-CM

## 2010-12-29 HISTORY — DX: Gastro-esophageal reflux disease without esophagitis: K21.9

## 2010-12-29 HISTORY — DX: Essential (primary) hypertension: I10

## 2010-12-29 LAB — COMPREHENSIVE METABOLIC PANEL
ALT: 10 U/L (ref 0–35)
AST: 15 U/L (ref 0–37)
Albumin: 3.3 g/dL — ABNORMAL LOW (ref 3.5–5.2)
Alkaline Phosphatase: 60 U/L (ref 39–117)
BUN: 10 mg/dL (ref 6–23)
CO2: 26 mEq/L (ref 19–32)
Calcium: 9.3 mg/dL (ref 8.4–10.5)
Chloride: 103 mEq/L (ref 96–112)
Creatinine, Ser: 0.69 mg/dL (ref 0.50–1.10)
GFR calc Af Amer: >90 mL/min (ref >90)
GFR calc non Af Amer: >90 mL/min (ref >90)
Glucose, Bld: 117 mg/dL — ABNORMAL HIGH (ref 70–99)
Potassium: 3.4 mEq/L — ABNORMAL LOW (ref 3.5–5.1)
Sodium: 137 mEq/L (ref 135–145)
Total Bilirubin: 0.1 mg/dL — ABNORMAL LOW (ref 0.3–1.2)
Total Protein: 9.8 g/dL — ABNORMAL HIGH (ref 6.0–8.3)

## 2010-12-29 LAB — CBC
HCT: 32.5 % — ABNORMAL LOW (ref 36.0–46.0)
Hemoglobin: 10.5 g/dL — ABNORMAL LOW (ref 12.0–15.0)
MCH: 28.5 pg (ref 26.0–34.0)
MCHC: 32.3 g/dL (ref 30.0–36.0)
MCV: 88.3 fL (ref 78.0–100.0)
Platelets: 374 10*3/uL (ref 150–400)
RBC: 3.68 MIL/uL — ABNORMAL LOW (ref 3.87–5.11)
RDW: 14.5 % (ref 11.5–15.5)
WBC: 6.4 10*3/uL (ref 4.0–10.5)

## 2010-12-29 LAB — URINALYSIS, ROUTINE W REFLEX MICROSCOPIC
Bilirubin Urine: NEGATIVE
Glucose, UA: NEGATIVE mg/dL
Hgb urine dipstick: NEGATIVE
Ketones, ur: NEGATIVE mg/dL
Leukocytes, UA: NEGATIVE
Nitrite: NEGATIVE
Protein, ur: 100 mg/dL — AB
Specific Gravity, Urine: 1.025 (ref 1.005–1.030)
Urobilinogen, UA: 1 mg/dL (ref 0.0–1.0)
pH: 6 (ref 5.0–8.0)

## 2010-12-29 LAB — URINE MICROSCOPIC-ADD ON

## 2010-12-29 LAB — DIFFERENTIAL
Basophils Absolute: 0 10*3/uL (ref 0.0–0.1)
Basophils Relative: 0 % (ref 0–1)
Eosinophils Absolute: 0 10*3/uL (ref 0.0–0.7)
Eosinophils Relative: 0 % (ref 0–5)
Lymphocytes Relative: 9 % — ABNORMAL LOW (ref 12–46)
Lymphs Abs: 0.6 10*3/uL — ABNORMAL LOW (ref 0.7–4.0)
Monocytes Absolute: 0.2 10*3/uL (ref 0.1–1.0)
Monocytes Relative: 4 % (ref 3–12)
Neutro Abs: 5.5 10*3/uL (ref 1.7–7.7)
Neutrophils Relative %: 87 % — ABNORMAL HIGH (ref 43–77)

## 2010-12-29 LAB — LIPASE, BLOOD: Lipase: 19 U/L (ref 11–59)

## 2010-12-29 MED ORDER — SODIUM CHLORIDE 0.9 % IV SOLN
INTRAVENOUS | Status: DC
  Administered 2010-12-29: 05:00:00 via INTRAVENOUS

## 2010-12-29 MED ORDER — GI COCKTAIL ~~LOC~~
30.0000 mL | Freq: Once | ORAL | Status: AC
  Administered 2010-12-29: 30 mL via ORAL
  Filled 2010-12-29: qty 30

## 2010-12-29 MED ORDER — ONDANSETRON HCL 4 MG/2ML IJ SOLN
4.0000 mg | Freq: Once | INTRAMUSCULAR | Status: AC
  Administered 2010-12-29: 4 mg via INTRAVENOUS
  Filled 2010-12-29: qty 2

## 2010-12-29 MED ORDER — SODIUM CHLORIDE 0.9 % IV BOLUS (SEPSIS)
500.0000 mL | Freq: Once | INTRAVENOUS | Status: AC
  Administered 2010-12-29: 1000 mL via INTRAVENOUS

## 2010-12-29 MED ORDER — FAMOTIDINE 20 MG PO TABS: 20.0000 mg | ORAL_TABLET | Freq: Two times a day (BID) | ORAL | Status: DC

## 2010-12-29 MED ORDER — MORPHINE SULFATE 10 MG/ML IJ SOLN
INTRAMUSCULAR | Status: AC
  Administered 2010-12-29: 4 mg via INTRAVENOUS
  Filled 2010-12-29: qty 1

## 2010-12-29 MED ORDER — MORPHINE SULFATE 4 MG/ML IJ SOLN: 4.0000 mg | Freq: Once | INTRAMUSCULAR | Status: DC

## 2010-12-29 MED ORDER — HYDROCODONE-ACETAMINOPHEN 7.5-325 MG/15ML PO SOLN: 15.0000 mL | ORAL | Status: AC | PRN

## 2010-12-29 MED ORDER — PROMETHAZINE HCL 25 MG PO TABS: 25.0000 mg | ORAL_TABLET | Freq: Four times a day (QID) | ORAL | Status: AC | PRN

## 2010-12-29 MED ORDER — SUCRALFATE 1 GM/10ML PO SUSP: 1.0000 g | Freq: Four times a day (QID) | ORAL | Status: AC

## 2010-12-29 NOTE — ED Notes (Signed)
Central pain has returned gicoctail given po

## 2010-12-29 NOTE — ED Notes (Signed)
Pt. IV discontinued.  Pt. Provided with a gingerale, and asked to change out of hospital gown and into regular clothes.  Will notify RN that patient is ready for discharge.

## 2010-12-29 NOTE — ED Provider Notes (Signed)
History     CSN: [id number] Arrival date & time: 12/29/2010  1:28 AM   First MD Initiated Contact with Patient 12/29/10 0134      Chief Complaint  Patient presents with  . Abdominal Pain    HPI: Patient is a 35 y.o. female presenting with abdominal pain. The history is provided by the patient.  Abdominal Pain The primary symptoms of the illness include abdominal pain. The current episode started 13 to 24 hours ago. The onset of the illness was sudden.  reports onset of upper abdominal pain at approximately 6:30pm  last evening. Pain started approximately 2 hours after eating a meal and has been associated with nausea but no vomiting. Patient descibes pain as burning, sharp at times and denies fever, SOB,  diarrhea, UTI symptoms, vaginal discharge or other associated symptoms.  Past Medical History  Diagnosis Date  . Acid reflux disease   . Hypertension     Past Surgical History  Procedure Date  . Cesarean section     Family History  Problem Relation Age of Onset  . Hypertension Mother   . Diabetes Father   . Hypertension Father     History  Substance Use Topics  . Smoking status: Never Smoker   . Smokeless tobacco: Never Used  . Alcohol Use: Yes     shot of liquor every three weeks    OB History    Grav Para Term Preterm Abortions TAB SAB Ect Mult Living   4 4 4              Review of Systems  Constitutional: Negative.   HENT: Negative.   Eyes: Negative.   Respiratory: Negative.   Cardiovascular: Negative.   Gastrointestinal: Positive for abdominal pain.  Genitourinary: Negative.   Musculoskeletal: Negative.   Skin: Negative.   Neurological: Negative.   Hematological: Negative.   Psychiatric/Behavioral: Negative.     Allergies  Review of patient's allergies indicates no known allergies.  Home Medications   Current Outpatient Rx  Name Route Sig Dispense Refill  . HYDROCHLOROTHIAZIDE 25 MG PO TABS Oral Take 25 mg by mouth daily.     Marland Kitchen SIMETHICONE  80 MG PO CHEW Oral Chew 80 mg by mouth every 6 (six) hours as needed. Gas or upset stomach       BP 129/75  Pulse 88  Temp(Src) 98.4 F (36.9 C) (Oral)  Resp 14  SpO2 100%  LMP 10/17/2010  Physical Exam  Constitutional: She is oriented to person, place, and time. She appears well-developed and well-nourished.  HENT:  Head: Normocephalic and atraumatic.  Eyes: Conjunctivae are normal.  Neck: Neck supple.  Cardiovascular: Normal rate and regular rhythm.   Pulmonary/Chest: Effort normal and breath sounds normal.  Abdominal: Soft. Bowel sounds are normal.    Musculoskeletal: Normal range of motion.  Neurological: She is alert and oriented to person, place, and time. She has normal reflexes.  Skin: Skin is warm and dry.  Psychiatric: She has a normal mood and affect.    ED Course  Procedures: Findings and d/c plan discussed w/ pt. Pt agreeable w/ plan.  Labs Reviewed  CBC - Abnormal; Notable for the following:    RBC 3.68 (*)    Hemoglobin 10.5 (*)    HCT 32.5 (*)    All other components within normal limits  DIFFERENTIAL - Abnormal; Notable for the following:    Neutrophils Relative 87 (*)    Lymphocytes Relative 9 (*)    Lymphs Abs 0.6 (*)  All other components within normal limits  COMPREHENSIVE METABOLIC PANEL - Abnormal; Notable for the following:    Potassium 3.4 (*)    Glucose, Bld 117 (*)    Total Protein 9.8 (*)    Albumin 3.3 (*)    Total Bilirubin 0.1 (*)    All other components within normal limits  URINALYSIS, ROUTINE W REFLEX MICROSCOPIC - Abnormal; Notable for the following:    APPearance CLOUDY (*)    Protein, ur 100 (*)    All other components within normal limits  URINE MICROSCOPIC-ADD ON - Abnormal; Notable for the following:    Squamous Epithelial / LPF MANY (*)    Bacteria, UA MANY (*)    All other components within normal limits  LIPASE, BLOOD  POCT PREGNANCY, URINE  POCT PREGNANCY, URINE   US Abdomen Complete  12/29/2010  *RADIOLOGY  REPORT*  Clinical Data:  Epigastric abdominal pain, nausea and vomiting.  ABDOMINAL ULTRASOUND COMPLETE  Comparison:  None  Findings:  Gallbladder:  The gallbladder is normal in appearance, without evidence for gallstones or pericholecystic fluid.  Borderline gallbladder wall thickening is noted.  No ultrasonographic Murphy's sign is elicited.  Common Bile Duct:  0.3 cm in diameter; within normal limits in caliber.  Liver:  Mildly heterogeneous echogenicity and slightly coarsened echotexture; no focal lesions identified.  Limited Doppler evaluation demonstrates normal blood flow within the liver.  IVC:  Unremarkable in appearance; not well characterized distally due to overlying bowel gas.  Pancreas:  Although the pancreas is difficult to visualize in its entirety due to overlying bowel gas, no focal pancreatic abnormality is identified.  Spleen:  8.8 cm in length; within normal limits in size and echotexture.  Right kidney:  11.6 cm in length; normal in size, configuration and parenchymal echogenicity.  No evidence of mass or hydronephrosis.  Left kidney:  11.6 cm in length; normal in size, configuration and parenchymal echogenicity.  No evidence of mass or hydronephrosis.  Abdominal Aorta:  Normal in caliber; no aneurysm identified.  IMPRESSION:  1.  Mildly heterogeneous hepatic echogenicity is nonspecific; no definite evidence for hepatitis. 2.  Gallbladder grossly unremarkable in appearance; borderline gallbladder wall thickening likely remains within normal limits.  Original Report Authenticated By: Tonia Ghent, M.D.     No diagnosis found.    MDM  HPI, PE and clinical findings c/w epigastric pain associated w/ reflux.       Leanne Chang, NP 01/03/11 1020

## 2010-12-29 NOTE — ED Notes (Signed)
Pt reports abdominal pain is epigastric.

## 2010-12-29 NOTE — ED Notes (Signed)
Resting more comfortable

## 2010-12-29 NOTE — ED Notes (Signed)
Pt reports having abdominal pain post dinner around 6:30pm. States pain is a "cramping" sensation. Pt states she had fried salmon cakes and greens for dinner.  Pt states that she took some Gas-X for the pain with no relief. Pt reports nausea but no vomiting. Pt denies diarrhea. Denies painful urination, vaginal odor or vaginal discharge. Pt states that she has been diagnosed with acid reflux. Pt states abdominal pain is 9/10.

## 2010-12-29 NOTE — ED Notes (Signed)
Patient sts that she has been very nauseated since eating but hasn't vomited until now..  Vomited large amount of undigested food.  Int placed .Marland Kitchen USG in to do scan.  meds given as ordered and iv bolus started

## 2011-01-03 NOTE — ED Provider Notes (Signed)
Medical screening examination/treatment/procedure(s) were performed by non-physician practitioner and as supervising physician I was immediately available for consultation/collaboration.  Latiana Tomei, MD 01/03/11 1557 

## 2012-09-06 ENCOUNTER — Encounter (HOSPITAL_COMMUNITY): Payer: Self-pay | Admitting: Emergency Medicine

## 2012-09-06 ENCOUNTER — Emergency Department (HOSPITAL_COMMUNITY)
Admission: EM | Admit: 2012-09-06 | Discharge: 2012-09-06 | Disposition: A | Discharge status: Home / Self Care | Payer: Self-pay | Attending: Emergency Medicine | Admitting: Emergency Medicine

## 2012-09-06 DIAGNOSIS — K219 Gastro-esophageal reflux disease without esophagitis: Secondary | ICD-10-CM | POA: Insufficient documentation

## 2012-09-06 DIAGNOSIS — I10 Essential (primary) hypertension: Secondary | ICD-10-CM | POA: Insufficient documentation

## 2012-09-06 DIAGNOSIS — M7989 Other specified soft tissue disorders: Secondary | ICD-10-CM | POA: Insufficient documentation

## 2012-09-06 DIAGNOSIS — M25439 Effusion, unspecified wrist: Secondary | ICD-10-CM

## 2012-09-06 DIAGNOSIS — Z79899 Other long term (current) drug therapy: Secondary | ICD-10-CM | POA: Insufficient documentation

## 2012-09-06 DIAGNOSIS — Z3202 Encounter for pregnancy test, result negative: Secondary | ICD-10-CM | POA: Insufficient documentation

## 2012-09-06 LAB — URINALYSIS, ROUTINE W REFLEX MICROSCOPIC
Bilirubin Urine: NEGATIVE
Glucose, UA: NEGATIVE mg/dL
Hgb urine dipstick: NEGATIVE
Ketones, ur: NEGATIVE mg/dL
Nitrite: NEGATIVE
Specific Gravity, Urine: 1.011 (ref 1.005–1.030)
pH: 7 (ref 5.0–8.0)

## 2012-09-06 LAB — CBC WITH DIFFERENTIAL/PLATELET
Basophils Absolute: 0 10*3/uL (ref 0.0–0.1)
Basophils Relative: 1 % (ref 0–1)
Eosinophils Absolute: 0 10*3/uL (ref 0.0–0.7)
HCT: 30.6 % — ABNORMAL LOW (ref 36.0–46.0)
MCH: 29.7 pg (ref 26.0–34.0)
MCHC: 33.7 g/dL (ref 30.0–36.0)
Monocytes Absolute: 0.2 10*3/uL (ref 0.1–1.0)
Neutro Abs: 1.1 10*3/uL — ABNORMAL LOW (ref 1.7–7.7)
Neutrophils Relative %: 58 % (ref 43–77)
RDW: 14.3 % (ref 11.5–15.5)

## 2012-09-06 LAB — POCT I-STAT TROPONIN I

## 2012-09-06 LAB — BASIC METABOLIC PANEL
BUN: 11 mg/dL (ref 6–23)
Calcium: 9 mg/dL (ref 8.4–10.5)
GFR calc Af Amer: >90 mL/min (ref >90)
GFR calc non Af Amer: >90 mL/min (ref >90)
Glucose, Bld: 86 mg/dL (ref 70–99)
Potassium: 4.2 mEq/L (ref 3.5–5.1)

## 2012-09-06 NOTE — ED Provider Notes (Signed)
CSN: [id number]     Arrival date & time 09/06/12  0865 History     First MD Initiated Contact with Patient 09/06/12 [phone]     Chief Complaint  Patient presents with  . Tingling   (Consider location/radiation/quality/duration/timing/severity/associated sxs/prior Treatment) HPI Comments: Patient is a 37 year old female with a history of hypertension who presents for swelling to her bilateral forearms, distal to her elbows, b/l hands, and b/l feet with onset 2 days ago. Patient states that symptoms were gradual in onset without any aggravating factors. She has tried tylenol and Vicodin for symptoms without relief. Swelling associated with tingling in extremities as well as decreased sensation in hands, though patient denies true numbness. She further denies associated fevers, CP, SOB, lightheadedness, dizziness, redness or pallor, and extremity weakness. She admits to compliance with her BP medication; takes Prinzide daily. Denies hx of CHF, ACS, DM, and hx of DVT. Denies recent tick bites. Primary Care - [facility]   The history is provided by the patient. No language interpreter was used.    Past Medical History  Diagnosis Date  . Acid reflux disease   . Hypertension    Past Surgical History  Procedure Laterality Date  . Cesarean section     Family History  Problem Relation Age of Onset  . Hypertension Mother   . Diabetes Father   . Hypertension Father    History  Substance Use Topics  . Smoking status: Never Smoker   . Smokeless tobacco: Never Used  . Alcohol Use: Yes     Comment: shot of liquor every three weeks   OB History   Grav Para Term Preterm Abortions TAB SAB Ect Mult Living   4 4 4             Review of Systems  Constitutional: Negative for fever.  Respiratory: Negative for cough and shortness of breath.   Cardiovascular: Negative for chest pain.       +b/l upper extremity and b/l foot swelling  Skin: Negative for color change, pallor and rash.   Neurological: Negative for weakness and numbness.       +tingling in extremities    Allergies  Review of patient's allergies indicates no known allergies.  Home Medications   Current Outpatient Rx  Name  Route  Sig  Dispense  Refill  . ibuprofen (ADVIL,MOTRIN) 200 MG tablet   Oral   Take 400 mg by mouth every 6 (six) hours as needed for pain.         Marland Kitchen lisinopril-hydrochlorothiazide (PRINZIDE,ZESTORETIC) 10-12.5 MG per tablet   Oral   Take 1 tablet by mouth daily.          BP 129/84  Pulse 73  Temp(Src) 98.5 F (36.9 C) (Oral)  Resp 16  Ht 5\' 9"  (1.753 m)  Wt 226 lb (102.513 kg)  BMI 33.36 kg/m2  SpO2 100%  Physical Exam  Nursing note and vitals reviewed. Constitutional: She is oriented to person, place, and time. She appears well-developed and well-nourished. No distress.  HENT:  Head: Normocephalic and atraumatic.  Mouth/Throat: Oropharynx is clear and moist. No oropharyngeal exudate.  Eyes: Conjunctivae and EOM are normal. No scleral icterus.  Neck: Normal range of motion.  Cardiovascular: Normal rate, regular rhythm, normal heart sounds and intact distal pulses.   Distal radial, dorsalis pedis, and posterior tibial pulses 2+ bilaterally. Capillary refill normal in all extremities.  Pulmonary/Chest: Effort normal. No respiratory distress. She has no wheezes.  Musculoskeletal: Normal range of motion.  Minimal soft tissue swelling in b/l hands and wrists. No pitting edema appreciated in upper and lower extremities b/l. No TTP.  Neurological: She is alert and oriented to person, place, and time.  Patient moves extremities without ataxia. No sensory or motor deficits appreciated. Equal grip strength bilaterally with 5/5 strength against resistance in her upper and lower extremities. DTRs normal and symmetric.  Skin: Skin is warm and dry. No rash noted. She is not diaphoretic. No erythema. No pallor.  Psychiatric: She has a normal mood and affect. Her behavior is  normal.   ED Course   Procedures (including critical care time)  Labs Reviewed  CBC WITH DIFFERENTIAL - Abnormal; Notable for the following:    WBC 2.0 (*)    RBC 3.47 (*)    Hemoglobin 10.3 (*)    HCT 30.6 (*)    Neutro Abs 1.1 (*)    Lymphs Abs 0.6 (*)    All other components within normal limits  PRO B NATRIURETIC PEPTIDE  BASIC METABOLIC PANEL  PREGNANCY, URINE  URINALYSIS, ROUTINE W REFLEX MICROSCOPIC  POCT I-STAT TROPONIN I    Date: 09/06/2012  Rate: 71  Rhythm: normal sinus rhythm  QRS Axis: normal  Intervals: normal  ST/T Wave abnormalities: normal  Conduction Disutrbances:none  Narrative Interpretation: NSR without STEMI or ischemic changes  Old EKG Reviewed: none available I have personally reviewed and interpreted this EKG  No results found.  1. Wrist swelling, unspecified laterality   2. Hand swelling    MDM  Patient with hx of HTN presents for b/l extremity swelling. Patient hemodynamically stable and well and nontoxic appearing; neurovascularly intact on physical exam. Urine pregnancy negative, unremarkable BNP, and troponin 0.00. EKG without STEMI or ischemic changes. Given work up, doubt ACS or CHF. Patient appropriate for d/c with PCP follow up. Have advised patient decrease intake of sodium and processed foods. Indications for return provided and patient agreeable to plan.    Antony Madura, PA-C 09/11/12 1245

## 2012-09-06 NOTE — ED Notes (Signed)
Onset 2 days ago bilateral forearms and hands tingling and swelling which has improved.  Able to move bilateral extremities equal and strong. Alert answering and following commands appropriate.

## 2012-09-13 NOTE — ED Provider Notes (Signed)
Medical screening examination/treatment/procedure(s) were performed by non-physician practitioner and as supervising physician I was immediately available for consultation/collaboration.  Courtney Cortez. Rubin Payor, MD 09/13/12 [phone]

## 2013-03-30 ENCOUNTER — Emergency Department (HOSPITAL_COMMUNITY)
Admission: EM | Admit: 2013-03-30 | Discharge: 2013-03-30 | Disposition: A | Discharge status: Home / Self Care | Payer: PRIVATE HEALTH INSURANCE | Attending: Emergency Medicine | Admitting: Emergency Medicine

## 2013-03-30 ENCOUNTER — Encounter (HOSPITAL_COMMUNITY): Payer: Self-pay | Admitting: Emergency Medicine

## 2013-03-30 DIAGNOSIS — Z8719 Personal history of other diseases of the digestive system: Secondary | ICD-10-CM | POA: Insufficient documentation

## 2013-03-30 DIAGNOSIS — R519 Headache, unspecified: Secondary | ICD-10-CM

## 2013-03-30 DIAGNOSIS — Z79899 Other long term (current) drug therapy: Secondary | ICD-10-CM | POA: Insufficient documentation

## 2013-03-30 DIAGNOSIS — Z3202 Encounter for pregnancy test, result negative: Secondary | ICD-10-CM | POA: Insufficient documentation

## 2013-03-30 DIAGNOSIS — Z791 Long term (current) use of non-steroidal anti-inflammatories (NSAID): Secondary | ICD-10-CM | POA: Insufficient documentation

## 2013-03-30 DIAGNOSIS — R51 Headache: Secondary | ICD-10-CM | POA: Insufficient documentation

## 2013-03-30 DIAGNOSIS — I10 Essential (primary) hypertension: Secondary | ICD-10-CM | POA: Insufficient documentation

## 2013-03-30 DIAGNOSIS — R1084 Generalized abdominal pain: Secondary | ICD-10-CM | POA: Insufficient documentation

## 2013-03-30 DIAGNOSIS — R110 Nausea: Secondary | ICD-10-CM | POA: Insufficient documentation

## 2013-03-30 LAB — I-STAT TROPONIN, ED: Troponin i, poc: 0 ng/mL (ref 0.00–0.08)

## 2013-03-30 LAB — URINALYSIS, ROUTINE W REFLEX MICROSCOPIC
BILIRUBIN URINE: NEGATIVE
Glucose, UA: NEGATIVE mg/dL
Hgb urine dipstick: NEGATIVE
Ketones, ur: NEGATIVE mg/dL
LEUKOCYTES UA: NEGATIVE
NITRITE: NEGATIVE
PH: 7 (ref 5.0–8.0)
Protein, ur: 30 mg/dL — AB
SPECIFIC GRAVITY, URINE: 1.007 (ref 1.005–1.030)
Urobilinogen, UA: 0.2 mg/dL (ref 0.0–1.0)

## 2013-03-30 LAB — URINE MICROSCOPIC-ADD ON

## 2013-03-30 LAB — COMPREHENSIVE METABOLIC PANEL
ALT: 14 U/L (ref 0–35)
AST: 20 U/L (ref 0–37)
Albumin: 3.3 g/dL — ABNORMAL LOW (ref 3.5–5.2)
Alkaline Phosphatase: 66 U/L (ref 39–117)
BUN: 9 mg/dL (ref 6–23)
CALCIUM: 9.7 mg/dL (ref 8.4–10.5)
CHLORIDE: 99 meq/L (ref 96–112)
CO2: 26 meq/L (ref 19–32)
Creatinine, Ser: 0.62 mg/dL (ref 0.50–1.10)
GFR calc Af Amer: >90 mL/min (ref >90)
Glucose, Bld: 104 mg/dL — ABNORMAL HIGH (ref 70–99)
Potassium: 3.6 mEq/L — ABNORMAL LOW (ref 3.7–5.3)
SODIUM: 136 meq/L — AB (ref 137–147)
Total Bilirubin: 0.3 mg/dL (ref 0.3–1.2)
Total Protein: 10 g/dL — ABNORMAL HIGH (ref 6.0–8.3)

## 2013-03-30 LAB — CBC WITH DIFFERENTIAL/PLATELET
BASOS ABS: 0 10*3/uL (ref 0.0–0.1)
Basophils Relative: 1 % (ref 0–1)
EOS PCT: 0 % (ref 0–5)
Eosinophils Absolute: 0 10*3/uL (ref 0.0–0.7)
HCT: 30 % — ABNORMAL LOW (ref 36.0–46.0)
Hemoglobin: 10.3 g/dL — ABNORMAL LOW (ref 12.0–15.0)
LYMPHS PCT: 23 % (ref 12–46)
Lymphs Abs: 0.9 10*3/uL (ref 0.7–4.0)
MCH: 29.9 pg (ref 26.0–34.0)
MCHC: 34.3 g/dL (ref 30.0–36.0)
MCV: 87.2 fL (ref 78.0–100.0)
Monocytes Absolute: 0.2 10*3/uL (ref 0.1–1.0)
Monocytes Relative: 5 % (ref 3–12)
NEUTROS ABS: 2.7 10*3/uL (ref 1.7–7.7)
Neutrophils Relative %: 71 % (ref 43–77)
PLATELETS: 321 10*3/uL (ref 150–400)
RBC: 3.44 MIL/uL — ABNORMAL LOW (ref 3.87–5.11)
RDW: 14.2 % (ref 11.5–15.5)
WBC: 3.8 10*3/uL — AB (ref 4.0–10.5)

## 2013-03-30 LAB — LIPASE, BLOOD: Lipase: 24 U/L (ref 11–59)

## 2013-03-30 LAB — PREGNANCY, URINE: PREG TEST UR: NEGATIVE

## 2013-03-30 MED ORDER — SODIUM CHLORIDE 0.9 % IV BOLUS (SEPSIS)
1000.0000 mL | Freq: Once | INTRAVENOUS | Status: AC
  Administered 2013-03-30: 1000 mL via INTRAVENOUS

## 2013-03-30 MED ORDER — DIPHENHYDRAMINE HCL 50 MG/ML IJ SOLN
25.0000 mg | Freq: Once | INTRAMUSCULAR | Status: AC
  Administered 2013-03-30: 25 mg via INTRAVENOUS
  Filled 2013-03-30: qty 1

## 2013-03-30 MED ORDER — KETOROLAC TROMETHAMINE 30 MG/ML IJ SOLN
30.0000 mg | Freq: Once | INTRAMUSCULAR | Status: AC
  Administered 2013-03-30: 30 mg via INTRAVENOUS
  Filled 2013-03-30: qty 1

## 2013-03-30 MED ORDER — DEXAMETHASONE SODIUM PHOSPHATE 10 MG/ML IJ SOLN
10.0000 mg | Freq: Once | INTRAMUSCULAR | Status: AC
  Administered 2013-03-30: 10 mg via INTRAVENOUS
  Filled 2013-03-30: qty 1

## 2013-03-30 MED ORDER — METOCLOPRAMIDE HCL 5 MG/ML IJ SOLN
10.0000 mg | Freq: Once | INTRAMUSCULAR | Status: AC
  Administered 2013-03-30: 10 mg via INTRAVENOUS
  Filled 2013-03-30: qty 2

## 2013-03-30 NOTE — ED Notes (Signed)
PA at bedside.

## 2013-03-30 NOTE — ED Provider Notes (Signed)
CSN: [id number]     Arrival date & time 03/30/13  1757 History   First MD Initiated Contact with Patient 03/30/13 2008     Chief Complaint  Patient presents with  . Headache  . Abdominal Pain     (Consider location/radiation/quality/duration/timing/severity/associated sxs/prior Treatment) Patient is a 38 y.o. female presenting with headaches and abdominal pain. The history is provided by the patient and medical records. No language interpreter was used.  Headache Associated symptoms: abdominal pain (generalized) and nausea   Associated symptoms: no back pain, no cough, no diarrhea, no fatigue, no fever, no neck stiffness and no vomiting   Abdominal Pain Associated symptoms: nausea   Associated symptoms: no chest pain, no constipation, no cough, no diarrhea, no dysuria, no fatigue, no fever, no hematuria, no shortness of breath and no vomiting     Courtney Cortez is a 38 y.o. female  with a hx of HTN (well controlled) and GERD presents to the Emergency Department complaining of gradual, waxing and waning, but never resolving generalized headache onset 3 days ago.  Pt reports intermittent hx of headaches, but none lasting several days.  She reports nausea and general abdominal discomfort, but no localized abd pain, fever, nausea, vomiting or diarrhea.  She reports taking hydrocodone at home for the pain without relief and believes this is what caused the nausea and abd discomfort.  Her largest complaint is feeling like she is not sleeping well because of the headache.  She has not attempted any other OTC medications for the pain.  Bending over makes it worse and nothing really makes it better. Pt denies fever, chills, neck pain, neck stiffness, CP, SOB, weakness, dizziness, syncope, dysuria, hematuria. She denies a Hx of migraine headaches.    Pt does endorse small area of rash to the left groin for the last 4 days, but does not feel it is related.     Past Medical History  Diagnosis Date  . Acid  reflux disease   . Hypertension    Past Surgical History  Procedure Laterality Date  . Cesarean section     Family History  Problem Relation Age of Onset  . Hypertension Mother   . Diabetes Father   . Hypertension Father    History  Substance Use Topics  . Smoking status: Never Smoker   . Smokeless tobacco: Never Used  . Alcohol Use: Yes     Comment: shot of liquor every three weeks   OB History   Grav Para Term Preterm Abortions TAB SAB Ect Mult Living   4 4 4             Review of Systems  Constitutional: Negative for fever, diaphoresis, appetite change, fatigue and unexpected weight change.  HENT: Negative for mouth sores.   Eyes: Negative for visual disturbance.  Respiratory: Negative for cough, chest tightness, shortness of breath and wheezing.   Cardiovascular: Negative for chest pain.  Gastrointestinal: Positive for nausea and abdominal pain (generalized). Negative for vomiting, diarrhea and constipation.  Endocrine: Negative for polydipsia, polyphagia and polyuria.  Genitourinary: Negative for dysuria, urgency, frequency and hematuria.  Musculoskeletal: Negative for back pain and neck stiffness.  Skin: Negative for rash.  Allergic/Immunologic: Negative for immunocompromised state.  Neurological: Positive for headaches. Negative for syncope and light-headedness.  Hematological: Does not bruise/bleed easily.  Psychiatric/Behavioral: Negative for sleep disturbance. The patient is not nervous/anxious.       Allergies  Review of patient's allergies indicates no known allergies.  Home Medications  Current Outpatient Rx  Name  Route  Sig  Dispense  Refill  . hydrochlorothiazide (HYDRODIURIL) 25 MG tablet   Oral   Take 25 mg by mouth daily.         Marland Kitchen ibuprofen (ADVIL,MOTRIN) 200 MG tablet   Oral   Take 400 mg by mouth every 6 (six) hours as needed for pain.         . meloxicam (MOBIC) 7.5 MG tablet   Oral   Take 7.5 mg by mouth daily.          BP  137/71  Pulse 90  Temp(Src) 99.8 F (37.7 C) (Oral)  Resp 18  Ht 5\' 8"  (1.727 m)  Wt 240 lb 14.4 oz (109.272 kg)  BMI 36.64 kg/m2  SpO2 100% Physical Exam  Nursing note and vitals reviewed. Constitutional: She is oriented to person, place, and time. She appears well-developed and well-nourished. No distress.  Awake, alert, nontoxic appearance  HENT:  Head: Normocephalic and atraumatic.  Mouth/Throat: Oropharynx is clear and moist. No oropharyngeal exudate.  Eyes: Conjunctivae and EOM are normal. Pupils are equal, round, and reactive to light. No scleral icterus.  Neck: Normal range of motion. Neck supple.  Full ROM without pain No midline or paraspinal tenderness No nuchal rigidity No meningeal signs  Cardiovascular: Normal rate, regular rhythm, normal heart sounds and intact distal pulses.   No murmur heard. Pulmonary/Chest: Effort normal and breath sounds normal. No respiratory distress. She has no wheezes. She has no rales.  Abdominal: Soft. Bowel sounds are normal. She exhibits no distension and no mass. There is no tenderness. There is no rebound and no guarding.  Genitourinary:  Small area of irritation along the L groin area without induration or evidence of abscess   Musculoskeletal: Normal range of motion. She exhibits no edema.  Lymphadenopathy:    She has no cervical adenopathy.  Neurological: She is alert and oriented to person, place, and time. She has normal reflexes. No cranial nerve deficit. She exhibits normal muscle tone. Coordination normal.  Speech is clear and goal oriented, follows commands Cranial nerves III - XII without deficit, no facial droop Normal strength in upper and lower extremities bilaterally, strong and equal grip strength Sensation normal to light and sharp touch Moves extremities without ataxia, coordination intact Normal finger to nose and rapid alternating movements Neg romberg, no pronator drift Normal gait Normal heel-shin and  balance   Skin: Skin is warm and dry. No rash noted. She is not diaphoretic. No erythema.  Psychiatric: She has a normal mood and affect. Her behavior is normal. Judgment and thought content normal.    ED Course  Procedures (including critical care time) Labs Review Labs Reviewed  CBC WITH DIFFERENTIAL - Abnormal; Notable for the following:    WBC 3.8 (*)    RBC 3.44 (*)    Hemoglobin 10.3 (*)    HCT 30.0 (*)    All other components within normal limits  COMPREHENSIVE METABOLIC PANEL - Abnormal; Notable for the following:    Sodium 136 (*)    Potassium 3.6 (*)    Glucose, Bld 104 (*)    Total Protein 10.0 (*)    Albumin 3.3 (*)    All other components within normal limits  URINALYSIS, ROUTINE W REFLEX MICROSCOPIC - Abnormal; Notable for the following:    APPearance HAZY (*)    Protein, ur 30 (*)    All other components within normal limits  URINE MICROSCOPIC-ADD ON - Abnormal; Notable for  the following:    Squamous Epithelial / LPF MANY (*)    Bacteria, UA FEW (*)    All other components within normal limits  LIPASE, BLOOD  PREGNANCY, URINE  I-STAT TROPOININ, ED   Imaging Review No results found.   EKG Interpretation None      MDM   Final diagnoses:  Headache  Nausea    Courtney Cortez presents with headache and generalized abd discomfort with associated nausea without vomiting or diarrhea.  Pt with normal neurologic exam and no focal neurologic deficits.    10:10 PM Pt HA treated and improved while in ED.  Presentation is non concerning for [facility], ICH, Meningitis, or temporal arteritis. Pt is afebrile with no focal neuro deficits, nuchal rigidity, or change in vision. Pt is to follow up with PCP for re-evaluation and for recheck of her rash. Pt verbalizes understanding and is agreeable with plan to dc.   It has been determined that no acute conditions requiring further emergency intervention are present at this time. The patient/guardian have been advised of the  diagnosis and plan. We have discussed signs and symptoms that warrant return to the ED, such as changes or worsening in symptoms.   Vital signs are stable at discharge.   BP 137/71  Pulse 90  Temp(Src) 99.8 F (37.7 C) (Oral)  Resp 18  Ht 5\' 8"  (1.727 m)  Wt 240 lb 14.4 oz (109.272 kg)  BMI 36.64 kg/m2  SpO2 100%  Patient/guardian has voiced understanding and agreed to follow-up with the PCP or specialist.       Dierdre ForthHannah Trinnity Breunig, PA-C 03/30/13 2337

## 2013-03-30 NOTE — Discharge Instructions (Signed)
1. Medications: usual home medications 2. Treatment: rest, drink plenty of fluids,  3. Follow Up: Please followup with your primary doctor for discussion of your diagnoses and further evaluation after today's visit; if you do not have a primary care doctor use the resource guide provided to find one;   Migraine Headache A migraine headache is an intense, throbbing pain on one or both sides of your head. A migraine can last for 30 minutes to several hours. CAUSES  The exact cause of a migraine headache is not always known. However, a migraine may be caused when nerves in the brain become irritated and release chemicals that cause inflammation. This causes pain. Certain things may also trigger migraines, such as:  Alcohol.  Smoking.  Stress.  Menstruation.  Aged cheeses.  Foods or drinks that contain nitrates, glutamate, aspartame, or tyramine.  Lack of sleep.  Chocolate.  Caffeine.  Hunger.  Physical exertion.  Fatigue.  Medicines used to treat chest pain (nitroglycerine), birth control pills, estrogen, and some blood pressure medicines. SIGNS AND SYMPTOMS  Pain on one or both sides of your head.  Pulsating or throbbing pain.  Severe pain that prevents daily activities.  Pain that is aggravated by any physical activity.  Nausea, vomiting, or both.  Dizziness.  Pain with exposure to bright lights, loud noises, or activity.  General sensitivity to bright lights, loud noises, or smells. Before you get a migraine, you may get warning signs that a migraine is coming (aura). An aura may include:  Seeing flashing lights.  Seeing bright spots, halos, or zig-zag lines.  Having tunnel vision or blurred vision.  Having feelings of numbness or tingling.  Having trouble talking.  Having muscle weakness. DIAGNOSIS  A migraine headache is often diagnosed based on:  Symptoms.  Physical exam.  A CT scan or MRI of your head. These imaging tests cannot diagnose  migraines, but they can help rule out other causes of headaches. TREATMENT Medicines may be given for pain and nausea. Medicines can also be given to help prevent recurrent migraines.  HOME CARE INSTRUCTIONS  Only take over-the-counter or prescription medicines for pain or discomfort as directed by your health care provider. The use of long-term narcotics is not recommended.  Lie down in a dark, quiet room when you have a migraine.  Keep a journal to find out what may trigger your migraine headaches. For example, write down:  What you eat and drink.  How much sleep you get.  Any change to your diet or medicines.  Limit alcohol consumption.  Quit smoking if you smoke.  Get 7 9 hours of sleep, or as recommended by your health care provider.  Limit stress.  Keep lights dim if bright lights bother you and make your migraines worse. SEEK IMMEDIATE MEDICAL CARE IF:   Your migraine becomes severe.  You have a fever.  You have a stiff neck.  You have vision loss.  You have muscular weakness or loss of muscle control.  You start losing your balance or have trouble walking.  You feel faint or pass out.  You have severe symptoms that are different from your first symptoms. MAKE SURE YOU:   Understand these instructions.  Will watch your condition.  Will get help right away if you are not doing well or get worse. Document Released: 01/13/2005 Document Revised: 11/03/2012 Document Reviewed: 09/20/2012 [facility] Patient Information 2014 [employer], [place].

## 2013-03-30 NOTE — ED Notes (Signed)
Pt reports generalized abdominal pain x yesterday with nausea, denies V/D. Pt also reports HA x 2 days; denies blurred vision, lightheadedness. Pt reports taking Percocet since Monday for HA with no relief. 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 AO x4. Neuro intact.

## 2013-03-31 NOTE — ED Provider Notes (Signed)
Medical screening examination/treatment/procedure(s) were performed by non-physician practitioner and as supervising physician I was immediately available for consultation/collaboration.   EKG Interpretation None       Glynn OctaveStephen Issiah Huffaker, MD 03/31/13 1109

## 2013-04-01 ENCOUNTER — Encounter (HOSPITAL_COMMUNITY): Payer: Self-pay | Admitting: Emergency Medicine

## 2013-04-01 ENCOUNTER — Emergency Department (INDEPENDENT_AMBULATORY_CARE_PROVIDER_SITE_OTHER)
Admission: EM | Admit: 2013-04-01 | Discharge: 2013-04-01 | Disposition: A | Discharge status: Home / Self Care | Payer: PRIVATE HEALTH INSURANCE | Source: Home / Self Care | Attending: Family Medicine | Admitting: Family Medicine

## 2013-04-01 ENCOUNTER — Emergency Department (HOSPITAL_COMMUNITY)
Admission: EM | Admit: 2013-04-01 | Discharge: 2013-04-02 | Disposition: A | Discharge status: Home / Self Care | Payer: PRIVATE HEALTH INSURANCE | Attending: Emergency Medicine | Admitting: Emergency Medicine

## 2013-04-01 DIAGNOSIS — R52 Pain, unspecified: Secondary | ICD-10-CM | POA: Insufficient documentation

## 2013-04-01 DIAGNOSIS — I10 Essential (primary) hypertension: Secondary | ICD-10-CM | POA: Insufficient documentation

## 2013-04-01 DIAGNOSIS — M25579 Pain in unspecified ankle and joints of unspecified foot: Secondary | ICD-10-CM | POA: Insufficient documentation

## 2013-04-01 DIAGNOSIS — M542 Cervicalgia: Secondary | ICD-10-CM | POA: Insufficient documentation

## 2013-04-01 DIAGNOSIS — R51 Headache: Secondary | ICD-10-CM

## 2013-04-01 DIAGNOSIS — R519 Headache, unspecified: Secondary | ICD-10-CM

## 2013-04-01 DIAGNOSIS — M25476 Effusion, unspecified foot: Secondary | ICD-10-CM | POA: Insufficient documentation

## 2013-04-01 DIAGNOSIS — Z791 Long term (current) use of non-steroidal anti-inflammatories (NSAID): Secondary | ICD-10-CM | POA: Insufficient documentation

## 2013-04-01 DIAGNOSIS — H53149 Visual discomfort, unspecified: Secondary | ICD-10-CM | POA: Insufficient documentation

## 2013-04-01 DIAGNOSIS — M25473 Effusion, unspecified ankle: Secondary | ICD-10-CM | POA: Insufficient documentation

## 2013-04-01 DIAGNOSIS — Z79899 Other long term (current) drug therapy: Secondary | ICD-10-CM | POA: Insufficient documentation

## 2013-04-01 DIAGNOSIS — G2581 Restless legs syndrome: Secondary | ICD-10-CM | POA: Insufficient documentation

## 2013-04-01 DIAGNOSIS — Z8719 Personal history of other diseases of the digestive system: Secondary | ICD-10-CM | POA: Insufficient documentation

## 2013-04-01 LAB — CBC
HCT: 26.6 % — ABNORMAL LOW (ref 36.0–46.0)
Hemoglobin: 9 g/dL — ABNORMAL LOW (ref 12.0–15.0)
MCH: 29.6 pg (ref 26.0–34.0)
MCHC: 33.8 g/dL (ref 30.0–36.0)
MCV: 87.5 fL (ref 78.0–100.0)
PLATELETS: 357 10*3/uL (ref 150–400)
RBC: 3.04 MIL/uL — ABNORMAL LOW (ref 3.87–5.11)
RDW: 14.2 % (ref 11.5–15.5)
WBC: 6.4 10*3/uL (ref 4.0–10.5)

## 2013-04-01 LAB — BASIC METABOLIC PANEL
BUN: 11 mg/dL (ref 6–23)
CALCIUM: 9.5 mg/dL (ref 8.4–10.5)
CO2: 25 mEq/L (ref 19–32)
CREATININE: 0.67 mg/dL (ref 0.50–1.10)
Chloride: 99 mEq/L (ref 96–112)
Glucose, Bld: 109 mg/dL — ABNORMAL HIGH (ref 70–99)
Potassium: 3.7 mEq/L (ref 3.7–5.3)
Sodium: 137 mEq/L (ref 137–147)

## 2013-04-01 MED ORDER — KETOROLAC TROMETHAMINE 30 MG/ML IJ SOLN
30.0000 mg | Freq: Once | INTRAMUSCULAR | Status: AC
  Administered 2013-04-01: 30 mg via INTRAVENOUS
  Filled 2013-04-01: qty 1

## 2013-04-01 MED ORDER — SODIUM CHLORIDE 0.9 % IV BOLUS (SEPSIS)
1000.0000 mL | Freq: Once | INTRAVENOUS | Status: AC
  Administered 2013-04-01: 1000 mL via INTRAVENOUS

## 2013-04-01 MED ORDER — METOCLOPRAMIDE HCL 5 MG/ML IJ SOLN
10.0000 mg | Freq: Once | INTRAMUSCULAR | Status: AC
  Administered 2013-04-01: 10 mg via INTRAVENOUS
  Filled 2013-04-01: qty 2

## 2013-04-01 NOTE — ED Notes (Signed)
PA at bedside.

## 2013-04-01 NOTE — ED Provider Notes (Signed)
CSN: [id number]     Arrival date & time 04/01/13  1207 History   First MD Initiated Contact with Patient 04/01/13 1311     Chief Complaint  Patient presents with  . Leg Swelling   (Consider location/radiation/quality/duration/timing/severity/associated sxs/prior Treatment) Patient is a 38 y.o. female presenting with general illness. The history is provided by the patient.  Illness Location:  Seen 3/4 in ER with neg w/u of sx , given meds but sx relapsing , worse. Severity:  Moderate Onset quality:  Gradual Duration:  3 days Progression:  Worsening Chronicity:  New Context:  Headache, joint swelling , restless legs, Associated symptoms: abdominal pain, fever, headaches and myalgias   Associated symptoms: no nausea and no vomiting     Past Medical History  Diagnosis Date  . Acid reflux disease   . Hypertension    Past Surgical History  Procedure Laterality Date  . Cesarean section     Family History  Problem Relation Age of Onset  . Hypertension Mother   . Diabetes Father   . Hypertension Father    History  Substance Use Topics  . Smoking status: Never Smoker   . Smokeless tobacco: Never Used  . Alcohol Use: Yes     Comment: shot of liquor every three weeks   OB History   Grav Para Term Preterm Abortions TAB SAB Ect Mult Living   4 4 4             Review of Systems  Constitutional: Positive for fever.  Gastrointestinal: Positive for abdominal pain. Negative for nausea and vomiting.  Musculoskeletal: Positive for myalgias.  Neurological: Positive for headaches.    Allergies  Review of patient's allergies indicates no known allergies.  Home Medications   Current Outpatient Rx  Name  Route  Sig  Dispense  Refill  . hydrochlorothiazide (HYDRODIURIL) 25 MG tablet   Oral   Take 25 mg by mouth daily.         Marland Kitchen ibuprofen (ADVIL,MOTRIN) 200 MG tablet   Oral   Take 400 mg by mouth every 6 (six) hours as needed for pain.         . meloxicam (MOBIC) 7.5 MG  tablet   Oral   Take 7.5 mg by mouth daily.          BP 123/89  Pulse 84  Temp(Src) 100 F (37.8 C) (Oral)  Resp 16  SpO2 100% Physical Exam  Nursing note and vitals reviewed. Constitutional: She is oriented to person, place, and time. She appears well-developed and well-nourished. She appears distressed.  HENT:  Head: Normocephalic.  Right Ear: External ear normal.  Left Ear: External ear normal.  Mouth/Throat: Oropharynx is clear and moist.  Eyes: Conjunctivae and EOM are normal. Pupils are equal, round, and reactive to light.  Neck: Normal range of motion. Neck supple.  Cardiovascular: Regular rhythm and normal heart sounds.   Pulmonary/Chest: Effort normal and breath sounds normal.  Abdominal: Soft. Bowel sounds are normal. There is no tenderness.  Musculoskeletal: She exhibits tenderness.  Lymphadenopathy:    She has no cervical adenopathy.  Neurological: She is alert and oriented to person, place, and time. No cranial nerve deficit.  Skin: Skin is warm and dry.    ED Course  Procedures (including critical care time) Labs Review Labs Reviewed - No data to display Imaging Review No results found.   MDM   1. Headache disorder     Sent for further eval of medical illness not improved from  prev ER visit 3/4.     Linna HoffJames D Kindl, MD 04/01/13 1335

## 2013-04-01 NOTE — ED Provider Notes (Signed)
CSN: [id number]     Arrival date & time 04/01/13  1420 History   First MD Initiated Contact with Patient 04/01/13 1854     Chief Complaint  Patient presents with  . Headache  . Generalized Body Aches     (Consider location/radiation/quality/duration/timing/severity/associated sxs/prior Treatment) HPI Comments: Courtney Cortez is a 38 y.o. year-old female with a past medical history of HTN and GERD, presenting the Emergency Department with a chief complaint of worsening frontal headache for 5 days.  The patient reports she was evaluated in the ED 2 days ago for similar complaints and went to [facility] for return of her headache.  She reports she was sent here by [facility] for further evaluation.  The patient reports a gradual onset of headache that is frontal and central. She also complains of restless legs and decreased sleep last night. She reports bilateral ankle pain, with swelling. She reports she has not taken OTC medication for the discomfort at home. No LMP recorded. Patient has had an injection. Primary Care - [facility]   Patient is a 38 y.o. female presenting with headaches. The history is provided by the patient and medical records. No language interpreter was used.  Headache Associated symptoms: neck pain and photophobia   Associated symptoms: no abdominal pain, no congestion, no cough, no ear pain, no fever, no nausea, no neck stiffness, no sinus pressure and no vomiting     Past Medical History  Diagnosis Date  . Acid reflux disease   . Hypertension    Past Surgical History  Procedure Laterality Date  . Cesarean section     Family History  Problem Relation Age of Onset  . Hypertension Mother   . Diabetes Father   . Hypertension Father    History  Substance Use Topics  . Smoking status: Never Smoker   . Smokeless tobacco: Never Used  . Alcohol Use: Yes     Comment: shot of liquor every three weeks   OB History   Grav Para Term Preterm Abortions TAB SAB Ect Mult Living   4 4 4             Review of Systems  Constitutional: Negative for fever and chills.  HENT: Negative for congestion, ear pain, rhinorrhea and sinus pressure.   Eyes: Positive for photophobia. Negative for visual disturbance.  Respiratory: Negative for cough.   Gastrointestinal: Negative for nausea, vomiting and abdominal pain.  Musculoskeletal: Positive for arthralgias, joint swelling and neck pain. Negative for neck stiffness.  Neurological: Positive for headaches.      Allergies  Review of patient's allergies indicates no known allergies.  Home Medications   Current Outpatient Rx  Name  Route  Sig  Dispense  Refill  . hydrochlorothiazide (HYDRODIURIL) 25 MG tablet   Oral   Take 25 mg by mouth daily.         Marland Kitchen ibuprofen (ADVIL,MOTRIN) 200 MG tablet   Oral   Take 400 mg by mouth every 6 (six) hours as needed for pain.         . meloxicam (MOBIC) 7.5 MG tablet   Oral   Take 7.5 mg by mouth daily.          BP 114/73  Pulse 90  Temp(Src) 99.4 F (37.4 C) (Oral)  Resp 21  SpO2 100% Physical Exam  Nursing note and vitals reviewed. Constitutional: She is oriented to person, place, and time. She appears well-developed and well-nourished. No distress.  HENT:  Head: Normocephalic and atraumatic.  Nose: Right sinus exhibits no maxillary sinus tenderness and no frontal sinus tenderness. Left sinus exhibits no maxillary sinus tenderness and no frontal sinus tenderness.  Eyes: EOM are normal. Pupils are equal, round, and reactive to light. No scleral icterus. Right eye exhibits no nystagmus. Left eye exhibits no nystagmus.  Neck: Normal range of motion. Neck supple. No rigidity.  Cardiovascular: Normal rate, regular rhythm and normal heart sounds.   No murmur heard. Pulmonary/Chest: Effort normal and breath sounds normal. She has no wheezes.  Abdominal: Soft. Bowel sounds are normal. There is no tenderness. There is no rebound and no guarding.  Musculoskeletal: Normal  range of motion. She exhibits no edema.  Bilateral ankles without tenderness, no obvious swelling, no deformities, full ROM,  N/V intact.  Neurological: She is alert and oriented to person, place, and time. No cranial nerve deficit or sensory deficit. She exhibits normal muscle tone. Coordination and gait normal. GCS eye subscore is 4. GCS verbal subscore is 5. GCS motor subscore is 6.  Speech is clear and goal oriented, follows commands Cranial nerves III - XII grossly intact, no facial droop Normal strength in upper and lower extremities bilaterally, strong and equal grip strength Sensation normal to light and sharp touch Moves all 4 extremities without ataxia, coordination intact    Skin: Skin is warm and dry. No rash noted.  Psychiatric: She has a normal mood and affect.    ED Course  Procedures (including critical care time) Labs Review Labs Reviewed  CBC - Abnormal; Notable for the following:    RBC 3.04 (*)    Hemoglobin 9.0 (*)    HCT 26.6 (*)    All other components within normal limits  BASIC METABOLIC PANEL - Abnormal; Notable for the following:    Glucose, Bld 109 (*)    All other components within normal limits   Imaging Review No results found.   EKG Interpretation None      MDM   Final diagnoses:  Headache   Pt resents for return of headache after being treated in the ED 2 days ago.  No neuro deficits on exam.  No obvious joint swelling or rash.  Full active ROM C-spine. Headache presentation is non concerning for [facility], ICH, Meningitis, or temporal arteritis. Pt is afebrile with no focal neuro deficits, nuchal rigidity, or change in vision. Labs, fluids and toradol ordered. EMR shows negative pregnancy 2 days ago, will not repeat at this time. Re-eval patient reports discomfort 8/10. Reglan and benadryl ordered. Re-eval: Pt resting comfortably in room reports discomfort 6/10. Decadron ordered. Re-eval: pt reports resolution of headache, requesting to be  discharged home at this time. Discussed lab results, and treatment plan with the patient.  [facility]  Return precautions given. Reports understanding and no other concerns at this time.  Patient is stable for discharge at this time.  Meds given in ED:  Medications  sodium chloride 0.9 % bolus 1,000 mL (0 mLs Intravenous Stopped 04/01/13 2204)  ketorolac (TORADOL) 30 MG/ML injection 30 mg (30 mg Intravenous Given 04/01/13 1959)  metoCLOPramide (REGLAN) injection 10 mg (10 mg Intravenous Given 04/01/13 2344)  dexamethasone (DECADRON) injection 10 mg (10 mg Intravenous Given 04/02/13 0034)  diphenhydrAMINE (BENADRYL) capsule 25 mg (25 mg Oral Given 04/02/13 0034)    Discharge Medication List as of 04/02/2013  1:30 AM        Clabe SealLauren M Aalaya Yadao, PA-C 04/04/13 1656

## 2013-04-01 NOTE — ED Notes (Signed)
Pt reports ongoing headache since Monday. States seen here Wednesday for same. Pt reports hand swelling and restless leg type symptoms. Pt awake, alert, NAD at present.

## 2013-04-01 NOTE — ED Notes (Signed)
Pt  Has   Low  Grade  Fever       Headache   And  Swelling  Of  Hands   And  Feet  Seen  In er  2  Days  Ago        And  She  States  Was  Given headache  Cocktail       She  States  She  Has  Gotten  Worse

## 2013-04-02 MED ORDER — DIPHENHYDRAMINE HCL 25 MG PO CAPS
25.0000 mg | ORAL_CAPSULE | Freq: Once | ORAL | Status: AC
  Administered 2013-04-02: 25 mg via ORAL
  Filled 2013-04-02: qty 1

## 2013-04-02 MED ORDER — DEXAMETHASONE SODIUM PHOSPHATE 10 MG/ML IJ SOLN
10.0000 mg | Freq: Once | INTRAMUSCULAR | Status: AC
  Administered 2013-04-02: 10 mg via INTRAVENOUS
  Filled 2013-04-02: qty 1

## 2013-04-02 NOTE — Discharge Instructions (Signed)
Call for a follow up appointment with [facility]. Return if Symptoms worsen.   Take medication as prescribed.

## 2013-04-12 NOTE — ED Provider Notes (Signed)
Medical screening examination/treatment/procedure(s) were performed by non-physician practitioner and as supervising physician I was immediately available for consultation/collaboration.   EKG Interpretation None        Shelda JakesScott W. Giovanne Nickolson, MD 04/12/13 [phone]

## 2013-11-15 ENCOUNTER — Encounter (HOSPITAL_COMMUNITY): Payer: Self-pay | Admitting: Emergency Medicine

## 2013-11-15 DIAGNOSIS — Z79899 Other long term (current) drug therapy: Secondary | ICD-10-CM | POA: Insufficient documentation

## 2013-11-15 DIAGNOSIS — Z8719 Personal history of other diseases of the digestive system: Secondary | ICD-10-CM | POA: Insufficient documentation

## 2013-11-15 DIAGNOSIS — R0789 Other chest pain: Secondary | ICD-10-CM | POA: Insufficient documentation

## 2013-11-15 DIAGNOSIS — I10 Essential (primary) hypertension: Secondary | ICD-10-CM | POA: Insufficient documentation

## 2013-11-15 DIAGNOSIS — Z791 Long term (current) use of non-steroidal anti-inflammatories (NSAID): Secondary | ICD-10-CM | POA: Diagnosis not present

## 2013-11-15 DIAGNOSIS — R079 Chest pain, unspecified: Secondary | ICD-10-CM | POA: Diagnosis present

## 2013-11-15 NOTE — ED Notes (Signed)
Pt. reports pain ( sharp/tightness)  across her chest onset this evening with mild SOB , denies cough or congestion , no nausea or diaphoresis .

## 2013-11-16 ENCOUNTER — Emergency Department (HOSPITAL_COMMUNITY): Payer: PRIVATE HEALTH INSURANCE

## 2013-11-16 ENCOUNTER — Encounter (HOSPITAL_COMMUNITY): Payer: Self-pay | Admitting: Radiology

## 2013-11-16 ENCOUNTER — Emergency Department (HOSPITAL_COMMUNITY)
Admission: EM | Admit: 2013-11-16 | Discharge: 2013-11-16 | Disposition: A | Discharge status: Home / Self Care | Payer: PRIVATE HEALTH INSURANCE | Attending: Emergency Medicine | Admitting: Emergency Medicine

## 2013-11-16 DIAGNOSIS — R079 Chest pain, unspecified: Secondary | ICD-10-CM

## 2013-11-16 LAB — CBC
HEMATOCRIT: 28.1 % — AB (ref 36.0–46.0)
Hemoglobin: 9.4 g/dL — ABNORMAL LOW (ref 12.0–15.0)
MCH: 29.2 pg (ref 26.0–34.0)
MCHC: 33.5 g/dL (ref 30.0–36.0)
MCV: 87.3 fL (ref 78.0–100.0)
PLATELETS: 318 10*3/uL (ref 150–400)
RBC: 3.22 MIL/uL — AB (ref 3.87–5.11)
RDW: 13.9 % (ref 11.5–15.5)
WBC: 6.6 10*3/uL (ref 4.0–10.5)

## 2013-11-16 LAB — BASIC METABOLIC PANEL
ANION GAP: 9 (ref 5–15)
BUN: 8 mg/dL (ref 6–23)
CHLORIDE: 98 meq/L (ref 96–112)
CO2: 24 meq/L (ref 19–32)
Calcium: 9.1 mg/dL (ref 8.4–10.5)
Creatinine, Ser: 0.71 mg/dL (ref 0.50–1.10)
GFR calc Af Amer: >90 mL/min (ref >90)
GFR calc non Af Amer: >90 mL/min (ref >90)
Glucose, Bld: 107 mg/dL — ABNORMAL HIGH (ref 70–99)
Potassium: 3.6 mEq/L — ABNORMAL LOW (ref 3.7–5.3)
Sodium: 131 mEq/L — ABNORMAL LOW (ref 137–147)

## 2013-11-16 LAB — I-STAT TROPONIN, ED
TROPONIN I, POC: 0 ng/mL (ref 0.00–0.08)
Troponin i, poc: 0 ng/mL (ref 0.00–0.08)

## 2013-11-16 LAB — PRO B NATRIURETIC PEPTIDE: Pro B Natriuretic peptide (BNP): 63.1 pg/mL (ref 0–125)

## 2013-11-16 MED ORDER — IOHEXOL 350 MG/ML SOLN
100.0000 mL | Freq: Once | INTRAVENOUS | Status: AC | PRN
  Administered 2013-11-16: 100 mL via INTRAVENOUS

## 2013-11-16 MED ORDER — HYDROCODONE-ACETAMINOPHEN 5-325 MG PO TABS: 1.0000 | ORAL_TABLET | Freq: Two times a day (BID) | ORAL | Status: AC | PRN

## 2013-11-16 MED ORDER — KETOROLAC TROMETHAMINE 30 MG/ML IJ SOLN
30.0000 mg | Freq: Once | INTRAMUSCULAR | Status: AC
Start: 2013-11-16 — End: 2013-11-16
  Administered 2013-11-16: 30 mg via INTRAVENOUS
  Filled 2013-11-16: qty 1

## 2013-11-16 MED ORDER — SODIUM CHLORIDE 0.9 % IV BOLUS (SEPSIS)
1000.0000 mL | Freq: Once | INTRAVENOUS | Status: AC
  Administered 2013-11-16: 1000 mL via INTRAVENOUS

## 2013-11-16 NOTE — ED Notes (Signed)
NAD at this time.  

## 2013-11-16 NOTE — ED Provider Notes (Signed)
CSN: [id number]     Arrival date & time 11/15/13  2341 History   First MD Initiated Contact with Patient 11/16/13 0143     Chief Complaint  Patient presents with  . Chest Pain     (Consider location/radiation/quality/duration/timing/severity/associated sxs/prior Treatment) HPI Courtney Cortez is a 38 y.o. female with past medical history of acid reflux and hypertension coming in with chest pain. Patient states her chest pain is under her bilateral breasts with radiation to her back. This occurred while sitting at church around 8:30 PM. It was sudden onset. She denies this ever occurring in the past. It is worse when she sits down, takes a deep breath, or walks. She has shortness of breath associated but denies emesis or diaphoresis. The pain feels as if it's a contraction from giving birth. Nothing has made her symptoms better, she took an aspirin without any relief.  Patient states the pain has been consistent and has not gone away since its onset. She denies fevers or recent infections. She has no cough. Patient has no further complaints.  10 Systems reviewed and are negative for acute change except as noted in the HPI.     Past Medical History  Diagnosis Date  . Acid reflux disease   . Hypertension    Past Surgical History  Procedure Laterality Date  . Cesarean section     Family History  Problem Relation Age of Onset  . Hypertension Mother   . Diabetes Father   . Hypertension Father    History  Substance Use Topics  . Smoking status: Never Smoker   . Smokeless tobacco: Never Used  . Alcohol Use: Yes     Comment: shot of liquor every three weeks   OB History   Grav Para Term Preterm Abortions TAB SAB Ect Mult Living   4 4 4             Review of Systems    Allergies  Review of patient's allergies indicates no known allergies.  Home Medications   Prior to Admission medications   Medication Sig Start Date End Date Taking? Authorizing Provider  hydrochlorothiazide  (HYDRODIURIL) 25 MG tablet Take 25 mg by mouth daily.   Yes Historical Provider, MD  ibuprofen (ADVIL,MOTRIN) 200 MG tablet Take 400 mg by mouth every 6 (six) hours as needed for pain.   Yes Historical Provider, MD  loratadine (CLARITIN) 10 MG tablet Take 10 mg by mouth daily.   Yes Historical Provider, MD  meloxicam (MOBIC) 7.5 MG tablet Take 7.5 mg by mouth daily.   Yes Historical Provider, MD   BP 126/78  Pulse 83  Temp(Src) 98.4 F (36.9 C) (Oral)  Resp 26  Ht 5\' 8"  (1.727 m)  Wt 238 lb (107.956 kg)  BMI 36.20 kg/m2  SpO2 100% Physical Exam  Nursing note and vitals reviewed. Constitutional: She is oriented to person, place, and time. She appears well-developed and well-nourished. No distress.  HENT:  Head: Normocephalic and atraumatic.  Nose: Nose normal.  Mouth/Throat: Oropharynx is clear and moist. No oropharyngeal exudate.  Eyes: Conjunctivae and EOM are normal. Pupils are equal, round, and reactive to light. No scleral icterus.  Neck: Normal range of motion. Neck supple. No JVD present. No tracheal deviation present. No thyromegaly present.  Cardiovascular: Normal rate, regular rhythm and normal heart sounds.  Exam reveals no gallop and no friction rub.   No murmur heard. Pulmonary/Chest: Effort normal and breath sounds normal. No respiratory distress. She has no wheezes. She exhibits  no tenderness.  Abdominal: Soft. Bowel sounds are normal. She exhibits no distension and no mass. There is no tenderness. There is no rebound and no guarding.  Musculoskeletal: Normal range of motion. She exhibits no edema and no tenderness.  Lymphadenopathy:    She has no cervical adenopathy.  Neurological: She is alert and oriented to person, place, and time.  Skin: Skin is warm and dry. No rash noted. No erythema. No pallor.    ED Course  Procedures (including critical care time) Labs Review Labs Reviewed  CBC - Abnormal; Notable for the following:    RBC 3.22 (*)    Hemoglobin 9.4 (*)     HCT 28.1 (*)    All other components within normal limits  BASIC METABOLIC PANEL - Abnormal; Notable for the following:    Sodium 131 (*)    Potassium 3.6 (*)    Glucose, Bld 107 (*)    All other components within normal limits  PRO B NATRIURETIC PEPTIDE  I-STAT TROPOININ, ED  Rosezena SensorI-STAT TROPOININ, ED    Imaging Review Dg Chest 2 View  11/16/2013   CLINICAL DATA:  High blood pressure. Pain across mid chest. Chest tightness. Shortness of breath.  EXAM: CHEST  2 VIEW  COMPARISON:  None.  FINDINGS: Shallow inspiration. Borderline heart size with normal pulmonary vascularity. Atelectasis in the lung bases. No focal consolidation in the lungs. No blunting of costophrenic angles. No pneumothorax. Mediastinal contours appear intact.  IMPRESSION: Shallow inspiration with linear atelectasis in the lung bases.   Electronically Signed   By: Burman NievesWilliam  Stevens M.D.   On: 11/16/2013 00:22   Ct Angio Chest Pe W/cm &/or Wo Cm  11/16/2013   CLINICAL DATA:  Shortness of breath and chest pain since 8 p.m. last night.  EXAM: CT ANGIOGRAPHY CHEST WITH CONTRAST  TECHNIQUE: Multidetector CT imaging of the chest was performed using the standard protocol during bolus administration of intravenous contrast. Multiplanar CT image reconstructions and MIPs were obtained to evaluate the vascular anatomy.  CONTRAST:  100mL OMNIPAQUE IOHEXOL 350 MG/ML SOLN  COMPARISON:  None.  FINDINGS: Technically adequate study with good opacification of the central and segmental pulmonary arteries. No focal filling defects demonstrated. No evidence of significant pulmonary embolus.  Mild cardiac enlargement. Normal caliber thoracic aorta without evidence of dissection. Increased density in the anterior mediastinum is probably due to residual thymic tissue. Esophagus is decompressed. Mediastinal lymph nodes are not pathologically enlarged.  Evaluation of lungs is limited due to respiratory motion artifact but there appears to be atelectasis in  both lung bases. No pleural effusion. No pneumothorax. No destructive bone lesions appreciated.  Included portions of the upper abdominal organs are grossly unremarkable.  Review of the MIP images confirms the above findings.  IMPRESSION: No evidence of significant pulmonary embolus. Atelectasis in the lung bases.   Electronically Signed   By: Burman NievesWilliam  Stevens M.D.   On: 11/16/2013 03:09     EKG Interpretation   Date/Time:  Tuesday November 15 2013 23:46:59 EDT Ventricular Rate:  95 PR Interval:  142 QRS Duration: 82 QT Interval:  380 QTC Calculation: 477 R Axis:   16 Text Interpretation:  Sinus rhythm with occasional Premature ventricular  complexes T wave inversion Inferior leads and lateral leads Abnormal ECG  Confirmed by Erroll Lunani, Loredana Medellin Ayokunle [phone]([id number]) on 11/16/2013 2:45:21 AM      MDM   Final diagnoses:  None    Patient does emergency department for concern for chest pain. Will evaluate with CT scan  of the chest for pulmonary embolism. She's denying any risk factors such as long distance travel, long periods of immobilization or estrogen use. She was given IV fluids and Toradol for pain control.  CT is negative for pulmonary embolism. Patient's pain significantly improved after Toradol.  Will obtain delta troponin is negative patient will be discharged. She is advised to call primary care physician for continued evaluation of her chest pain. Patient's heart score is a 2, her vital signs the remaining within her normal limits, she is safe for discharge.    Tomasita Crumble, MD 11/16/13 [phone]

## 2013-11-16 NOTE — Discharge Instructions (Signed)
Chest Pain (Nonspecific) Ms. Palau, you were seen today for chest pain. Your heart markers were negative on both blood draws. Your CT scan did not reveal any blood clots. Followup with a primary care physician within 3 days for continued treatment. If his symptoms worsen come back to the emergency department immediately for repeat evaluation. Thank you. It is often hard to give a diagnosis for the cause of chest pain. There is always a chance that your pain could be related to something serious, such as a heart attack or a blood clot in the lungs. You need to follow up with your doctor. HOME CARE  If antibiotic medicine was given, take it as directed by your doctor. Finish the medicine even if you start to feel better.  For the next few days, avoid activities that bring on chest pain. Continue physical activities as told by your doctor.  Do not use any tobacco products. This includes cigarettes, chewing tobacco, and e-cigarettes.  Avoid drinking alcohol.  Only take medicine as told by your doctor.  Follow your doctor's suggestions for more testing if your chest pain does not go away.  Keep all doctor visits you made. GET HELP IF:  Your chest pain does not go away, even after treatment.  You have a rash with blisters on your chest.  You have a fever. GET HELP RIGHT AWAY IF:   You have more pain or pain that spreads to your arm, neck, jaw, back, or belly (abdomen).  You have shortness of breath.  You cough more than usual or cough up blood.  You have very bad back or belly pain.  You feel sick to your stomach (nauseous) or throw up (vomit).  You have very bad weakness.  You pass out (faint).  You have chills. This is an emergency. Do not wait to see if the problems will go away. Call your local emergency services (911 in U.S.). Do not drive yourself to the hospital. MAKE SURE YOU:   Understand these instructions.  Will watch your condition.  Will get help right away if  you are not doing well or get worse. Document Released: 07/02/2007 Document Revised: 01/18/2013 Document Reviewed: 07/02/2007 [facility] Patient Information 2015 [place], [place]. This information is not intended to replace advice given to you by your health care provider. Make sure you discuss any questions you have with your health care provider.  Emergency Department Resource Guide 1) Find a Doctor and Pay Out of Pocket Although you won't have to find out who is covered by your insurance plan, it is a good idea to ask around and get recommendations. You will then need to call the office and see if the doctor you have chosen will accept you as a new patient and what types of options they offer for patients who are self-pay. Some doctors offer discounts or will set up payment plans for their patients who do not have insurance, but you will need to ask so you aren't surprised when you get to your appointment.  2) Contact Your Local Health Department Not all health departments have doctors that can see patients for sick visits, but many do, so it is worth a call to see if yours does. If you don't know where your local health department is, you can check in your phone book. The CDC also has a tool to help you locate your state's health department, and many state websites also have listings of all of their local health departments.  3) Find  a Walk-in Clinic If your illness is not likely to be very severe or complicated, you may want to try a walk in clinic. These are popping up all over the country in pharmacies, drugstores, and shopping centers. They're usually staffed by nurse practitioners or physician assistants that have been trained to treat common illnesses and complaints. They're usually fairly quick and inexpensive. However, if you have serious medical issues or chronic medical problems, these are probably not your best option.  No Primary Care Doctor: - Call Health Connect at  [phone] - they can  help you locate a primary care doctor that  accepts your insurance, provides certain services, etc. - Physician Referral Service- [phone]  Chronic Pain Problems: Organization         Address  Phone   Notes  [facility]  [phone] Patients need to be referred by their primary care doctor.   Medication Assistance: Organization         Address  Phone   Notes  [facility] [address] [place], [place] [address] [phone]) [phone] --Must be a resident of [facility] [place] -- Must have NO insurance coverage whatsoever (no Medicaid/ Medicare, etc.) -- The pt. MUST have a primary care doctor that directs their care regularly and follows them in the community   MedAssist  [phone]   [employer]  [phone]    Agencies that provide inexpensive medical care: Organization         Address  Phone   Notes  [facility]  [phone]   [facility]    [phone]   [facility] [address] [place], [place] [address] [phone]) [phone]   [facility] [address], [place] [phone]   [facility]anned Parenthood    [phone]   [facility]    [phone]   [facility]  [address], [place] Phone:  [phone], Fax:  [phone] Hours of Operation:  9 am - 6 pm, M-F.  Also accepts Medicaid/Medicare and self-pay.  [facility]  [address], [place] Phone: [phone], Fax: [phone]) [phone]. Hours of Operation:  8:30 am - 5:30 pm, M-F.  Also accepts Medicaid and self-pay.  [facility] [place] [address], [place] Phone: [phone]) [phone]   [facility] [address], [place], [place] [phone], Ext. [phone] Mondays & Thursdays: 7-9 AM.  First 15 patients are seen on a first come, first serve basis.    Medicaid-accepting [facility] Providers:  Organization         Address  Phone   Notes  [facility] [address], [place] [phone] Also accepts self-pay patients.  [facility] [address] [place], [place]  [phone]   [facility] [address], [place] [phone]   [facility] [address] [place] [address], [place] [phone]   Renaye RakersVeita Bland [address], [place]   [phone] Only accepts [place] Access [place] patients after they have their name applied to their card.   Self-Pay (no insurance) in [facility]:  Organization         Address  Phone   Notes  Sickle Cell Patients, [facility] [address]  [place], [place] [phone]   [facility] [address] [place], [place] [phone]   [facility]  [address], [place] [phone]   [facility]/Dr. Osei-Bonsu  [address] [place] or [address], [place] [phone] Phone number for both [place] and [place] locations is the same.  [facility] and [facility] [address] [place] [phone]   [facility] [address] [place] or [address] [phone]   [facility] [address], [place] [phone], phone; [phone], fax Sees patients 1st and 3rd Saturday of every month.  Must not qualify for public or private insurance (i.e. Medicaid, Medicare, [place] Health Choice, Veterans' Benefits)  Household income should be no more than 200% of the poverty level The clinic cannot treat you if you are pregnant or think you are pregnant  Sexually transmitted diseases are not treated at the clinic.    Dental Care: Organization         Address  Phone  Notes  [facility] Department of [facility]  Outpatient Pl[facility] Surgery Center [address] [place], [place] [phone] Accepts children up to age 8 who are enrolled in [place] or [place] Health Choice; pregnant women with a Medicaid card; and children who have applied for Medicaid or [place] Health Choice, but were declined, whose parents can pay a reduced fee at time of service.  [facility] Department of [facility]  [address], [place] [phone] Accepts children up to age 47 who are enrolled in [place] or [place] Health Choice; pregnant women with a Medicaid card; and children who have applied for Medicaid or [place] Health Choice, but were declined, whose parents can pay a reduced fee at time of service.  [facility]  [address] [place], [place] [phone] Patients are seen by appointment only. Walk-ins are not accepted. [facility] will see patients 35 years of age and older. Monday - Tuesday (8am-5pm) Most Wednesdays (8:30-5pm) $30 per visit, cash only  [facility]  [address], [facility] [phone] Patients are seen by appointment only. Walk-ins are not accepted. [facility] will see patients 4 years of age and older. One Wednesday Evening (Monthly: Volunteer Based).  $30 per visit, cash only  [employer]  [phone] for adults; Children under age 14, call Graduate Pediatric Dentistry at [phone]. Children aged 67-14, please call [phone] to request a pediatric application.  Dental services are provided in all areas of dental care including fillings, crowns and bridges, complete and partial dentures, implants, gum treatment, root canals, and extractions. Preventive care is also provided. Treatment is provided to both adults and children. Patients are selected via a lottery and there is often a waiting list.   [facility] [address] [place]  [phone] [email or web address]   [facility] [address] [place], [place] [phone], Ext. [phone] Second and Fourth Thursday of each month, opens at 6:30 AM; Clinic ends at 9 AM.  Patients are seen on a first-come first-served basis, and a limited number are seen during each clinic.   [facility]  [address] [place], [place] [phone]   Eligibility Requirements You must have lived in [place], [place], or  [place] counties for at least the last three months.   You cannot be eligible for state or federal sponsored National Cityhealthcare insurance, including CIGNAVeterans Administration, [place]icaid, or [employer]Medicare.   You generally cannot be eligible for healthcare insurance through your employer.    How to apply: Eligibility screenings are held every Tuesday and Wednesday afternoon from 1:00 pm until 4:00 pm. You do not need an appointment for the interview!  [facility] [address], [place], [place] [phone]   [facility]  [phone]   [facility]  [phone]   [facility]  [phone]    Behavioral Health Resources in the Community: Intensive Outpatient Programs Organization         Address  Phone  Notes  [facility] [address], [place], [place] [phone]   [facility] [address], [place], [place] [phone]   [facility] [address], [place], [place]  [phone]   [facility] [address],  [place], [place] [phone] or [phone]   Substance Abuse Resources Organization         Address  Phone  Notes  [facility]  [phone]   [facility]  [phone]   [facility]  [phone]   [facility]  [phone]   Residential & Outpatient Substance Abuse Program  [phone]   Psychological Services Organization         Address  Phone  Notes  [facility]  [phone]-  [phone]   [facility]  [phone]- [phone]   [facility] [address], [place] [phone] or [phone]    Mobile Crisis Teams Organization         Address  Phone  Notes  [facility], Mobile Crisis Care Unit  [phone]   [facility]  [address] [place], [place] [phone]   Doristine LocksSharon DeEsch [address] [place] [phone]    Self-Help/Support Groups Organization         Address  Phone             Notes  [facility] [place] - variety of support groups  [phone] Call for more information  Narcotics Anonymous (NA), [facility] [address], [place]  2 meetings at this location   Statisticianesidential Treatment Programs Organization         Address  Phone  Notes  [facility] [address],    [place]  [phone]   [facility]  [address], [place] [phone], [place], [place] [phone]   [facility] [address], [place] [phone] Admissions: 8am-3pm M-F  [facility] [address]    [place], [place] [phone]   [facility] [address], [place], [place] [phone]   [facility] [address]  [place], [place] [phone]   [facility] - Intensive Outpatient [address], [place], [place] [phone]   [facility] ([facility] [address]  [place], [place] [phone] or [phone]   [facility] ([facility]) [address] [place], [place] [phone] Accepts Medicaid  [facility] [address]  [place] [phone] Substance Abuse/Addiction Treatment   [facility] Resources Organization         Address  Phone  Notes  Estate manager/land agentCenterPoint Human Services  (  [phone]   Angie Fava, PhD [address], Ervin Knack [place], [place]   [phone] or  [phone]   [facility]al   [address] [place], [place] [phone]   [facility] [address], [place], [place] [phone] Insurance/Medicaid/sponsorship through [facility] and Families [address]                                    [place], [place] [phone] Therapy/tele-psych/case  [facility] [address], [place] [phone]    Dr. Lolly Mustache  [phone]   [facility]  [employer] [facility] Dept. 1) [address] [place] 2) [address], [place] 3)  [address], [place] [phone]  [phone]   [facility] Child Abuse Hotline 3076238931 or [phone] (After Hours)

## 2013-11-17 ENCOUNTER — Observation Stay (HOSPITAL_COMMUNITY): Payer: PRIVATE HEALTH INSURANCE

## 2013-11-17 ENCOUNTER — Inpatient Hospital Stay (HOSPITAL_COMMUNITY)
Admission: EM | Admit: 2013-11-17 | Discharge: 2013-11-27 | DRG: 870 | Disposition: E | Payer: PRIVATE HEALTH INSURANCE | Attending: Pulmonary Disease | Admitting: Pulmonary Disease

## 2013-11-17 ENCOUNTER — Emergency Department (HOSPITAL_COMMUNITY): Payer: PRIVATE HEALTH INSURANCE

## 2013-11-17 ENCOUNTER — Encounter (HOSPITAL_COMMUNITY): Payer: Self-pay | Admitting: Emergency Medicine

## 2013-11-17 DIAGNOSIS — R0600 Dyspnea, unspecified: Secondary | ICD-10-CM | POA: Diagnosis present

## 2013-11-17 DIAGNOSIS — E860 Dehydration: Secondary | ICD-10-CM | POA: Diagnosis present

## 2013-11-17 DIAGNOSIS — J90 Pleural effusion, not elsewhere classified: Secondary | ICD-10-CM | POA: Diagnosis present

## 2013-11-17 DIAGNOSIS — A4189 Other specified sepsis: Principal | ICD-10-CM | POA: Diagnosis present

## 2013-11-17 DIAGNOSIS — E669 Obesity, unspecified: Secondary | ICD-10-CM | POA: Diagnosis present

## 2013-11-17 DIAGNOSIS — M359 Systemic involvement of connective tissue, unspecified: Secondary | ICD-10-CM | POA: Diagnosis present

## 2013-11-17 DIAGNOSIS — I776 Arteritis, unspecified: Secondary | ICD-10-CM | POA: Diagnosis present

## 2013-11-17 DIAGNOSIS — R0781 Pleurodynia: Secondary | ICD-10-CM | POA: Diagnosis not present

## 2013-11-17 DIAGNOSIS — I509 Heart failure, unspecified: Secondary | ICD-10-CM | POA: Insufficient documentation

## 2013-11-17 DIAGNOSIS — A419 Sepsis, unspecified organism: Secondary | ICD-10-CM

## 2013-11-17 DIAGNOSIS — R000 Tachycardia, unspecified: Secondary | ICD-10-CM

## 2013-11-17 DIAGNOSIS — Z6837 Body mass index (BMI) 37.0-37.9, adult: Secondary | ICD-10-CM

## 2013-11-17 DIAGNOSIS — I313 Pericardial effusion (noninflammatory): Secondary | ICD-10-CM | POA: Diagnosis present

## 2013-11-17 DIAGNOSIS — R109 Unspecified abdominal pain: Secondary | ICD-10-CM

## 2013-11-17 DIAGNOSIS — I2699 Other pulmonary embolism without acute cor pulmonale: Secondary | ICD-10-CM

## 2013-11-17 DIAGNOSIS — R7989 Other specified abnormal findings of blood chemistry: Secondary | ICD-10-CM | POA: Insufficient documentation

## 2013-11-17 DIAGNOSIS — E875 Hyperkalemia: Secondary | ICD-10-CM | POA: Diagnosis not present

## 2013-11-17 DIAGNOSIS — R799 Abnormal finding of blood chemistry, unspecified: Secondary | ICD-10-CM

## 2013-11-17 DIAGNOSIS — Z01818 Encounter for other preprocedural examination: Secondary | ICD-10-CM

## 2013-11-17 DIAGNOSIS — B348 Other viral infections of unspecified site: Secondary | ICD-10-CM | POA: Diagnosis present

## 2013-11-17 DIAGNOSIS — R0602 Shortness of breath: Secondary | ICD-10-CM

## 2013-11-17 DIAGNOSIS — N170 Acute kidney failure with tubular necrosis: Secondary | ICD-10-CM | POA: Diagnosis not present

## 2013-11-17 DIAGNOSIS — F419 Anxiety disorder, unspecified: Secondary | ICD-10-CM | POA: Diagnosis not present

## 2013-11-17 DIAGNOSIS — M1990 Unspecified osteoarthritis, unspecified site: Secondary | ICD-10-CM | POA: Diagnosis present

## 2013-11-17 DIAGNOSIS — D696 Thrombocytopenia, unspecified: Secondary | ICD-10-CM | POA: Diagnosis present

## 2013-11-17 DIAGNOSIS — Z7982 Long term (current) use of aspirin: Secondary | ICD-10-CM

## 2013-11-17 DIAGNOSIS — R739 Hyperglycemia, unspecified: Secondary | ICD-10-CM | POA: Diagnosis not present

## 2013-11-17 DIAGNOSIS — M6282 Rhabdomyolysis: Secondary | ICD-10-CM | POA: Diagnosis present

## 2013-11-17 DIAGNOSIS — Z79899 Other long term (current) drug therapy: Secondary | ICD-10-CM

## 2013-11-17 DIAGNOSIS — J9601 Acute respiratory failure with hypoxia: Secondary | ICD-10-CM | POA: Diagnosis not present

## 2013-11-17 DIAGNOSIS — E876 Hypokalemia: Secondary | ICD-10-CM | POA: Diagnosis present

## 2013-11-17 DIAGNOSIS — I059 Rheumatic mitral valve disease, unspecified: Secondary | ICD-10-CM

## 2013-11-17 DIAGNOSIS — N179 Acute kidney failure, unspecified: Secondary | ICD-10-CM

## 2013-11-17 DIAGNOSIS — K219 Gastro-esophageal reflux disease without esophagitis: Secondary | ICD-10-CM | POA: Diagnosis present

## 2013-11-17 DIAGNOSIS — G9340 Encephalopathy, unspecified: Secondary | ICD-10-CM | POA: Diagnosis not present

## 2013-11-17 DIAGNOSIS — R6521 Severe sepsis with septic shock: Secondary | ICD-10-CM | POA: Diagnosis present

## 2013-11-17 DIAGNOSIS — I10 Essential (primary) hypertension: Secondary | ICD-10-CM | POA: Diagnosis present

## 2013-11-17 DIAGNOSIS — R34 Anuria and oliguria: Secondary | ICD-10-CM | POA: Diagnosis present

## 2013-11-17 DIAGNOSIS — J13 Pneumonia due to Streptococcus pneumoniae: Secondary | ICD-10-CM | POA: Diagnosis present

## 2013-11-17 DIAGNOSIS — I319 Disease of pericardium, unspecified: Secondary | ICD-10-CM | POA: Diagnosis present

## 2013-11-17 DIAGNOSIS — D638 Anemia in other chronic diseases classified elsewhere: Secondary | ICD-10-CM | POA: Diagnosis present

## 2013-11-17 DIAGNOSIS — I469 Cardiac arrest, cause unspecified: Secondary | ICD-10-CM | POA: Diagnosis not present

## 2013-11-17 DIAGNOSIS — E872 Acidosis: Secondary | ICD-10-CM | POA: Diagnosis not present

## 2013-11-17 DIAGNOSIS — Z4659 Encounter for fitting and adjustment of other gastrointestinal appliance and device: Secondary | ICD-10-CM

## 2013-11-17 DIAGNOSIS — J9600 Acute respiratory failure, unspecified whether with hypoxia or hypercapnia: Secondary | ICD-10-CM

## 2013-11-17 DIAGNOSIS — R680 Hypothermia, not associated with low environmental temperature: Secondary | ICD-10-CM | POA: Diagnosis not present

## 2013-11-17 DIAGNOSIS — A403 Sepsis due to Streptococcus pneumoniae: Secondary | ICD-10-CM | POA: Diagnosis present

## 2013-11-17 HISTORY — DX: Unspecified osteoarthritis, unspecified site: M19.90

## 2013-11-17 LAB — CBC WITH DIFFERENTIAL/PLATELET
BASOS PCT: 0 % (ref 0–1)
Basophils Absolute: 0 10*3/uL (ref 0.0–0.1)
EOS PCT: 0 % (ref 0–5)
Eosinophils Absolute: 0 10*3/uL (ref 0.0–0.7)
HEMATOCRIT: 30.2 % — AB (ref 36.0–46.0)
Hemoglobin: 10 g/dL — ABNORMAL LOW (ref 12.0–15.0)
LYMPHS ABS: 0.9 10*3/uL (ref 0.7–4.0)
Lymphocytes Relative: 11 % — ABNORMAL LOW (ref 12–46)
MCH: 28.8 pg (ref 26.0–34.0)
MCHC: 33.1 g/dL (ref 30.0–36.0)
MCV: 87 fL (ref 78.0–100.0)
Monocytes Absolute: 0.4 10*3/uL (ref 0.1–1.0)
Monocytes Relative: 5 % (ref 3–12)
NEUTROS ABS: 7.3 10*3/uL (ref 1.7–7.7)
Neutrophils Relative %: 84 % — ABNORMAL HIGH (ref 43–77)
PLATELETS: 289 10*3/uL (ref 150–400)
RBC: 3.47 MIL/uL — AB (ref 3.87–5.11)
RDW: 14.5 % (ref 11.5–15.5)
WBC MORPHOLOGY: INCREASED
WBC: 8.6 10*3/uL (ref 4.0–10.5)

## 2013-11-17 LAB — TROPONIN I
Troponin I: <0.3 ng/mL (ref <0.30)
Troponin I: <0.3 ng/mL (ref <0.30)
Troponin I: <0.3 ng/mL (ref <0.30)

## 2013-11-17 LAB — BASIC METABOLIC PANEL
ANION GAP: 12 (ref 5–15)
BUN: 16 mg/dL (ref 6–23)
CHLORIDE: 97 meq/L (ref 96–112)
CO2: 24 mEq/L (ref 19–32)
CREATININE: 0.93 mg/dL (ref 0.50–1.10)
Calcium: 9 mg/dL (ref 8.4–10.5)
GFR calc Af Amer: 90 mL/min — ABNORMAL LOW (ref >90)
GFR calc non Af Amer: 78 mL/min — ABNORMAL LOW (ref >90)
GLUCOSE: 117 mg/dL — AB (ref 70–99)
Potassium: 3.4 mEq/L — ABNORMAL LOW (ref 3.7–5.3)
Sodium: 133 mEq/L — ABNORMAL LOW (ref 137–147)

## 2013-11-17 LAB — PRO B NATRIURETIC PEPTIDE: Pro B Natriuretic peptide (BNP): 577.2 pg/mL — ABNORMAL HIGH (ref 0–125)

## 2013-11-17 LAB — HEPATIC FUNCTION PANEL
ALBUMIN: 2.9 g/dL — AB (ref 3.5–5.2)
ALT: 10 U/L (ref 0–35)
AST: 16 U/L (ref 0–37)
Alkaline Phosphatase: 53 U/L (ref 39–117)
Bilirubin, Direct: <0.2 mg/dL (ref 0.0–0.3)
TOTAL PROTEIN: 11 g/dL — AB (ref 6.0–8.3)
Total Bilirubin: 0.5 mg/dL (ref 0.3–1.2)

## 2013-11-17 LAB — RAPID URINE DRUG SCREEN, HOSP PERFORMED
Amphetamines: NOT DETECTED
BARBITURATES: POSITIVE — AB
Benzodiazepines: NOT DETECTED
Cocaine: NOT DETECTED
Opiates: NOT DETECTED
Tetrahydrocannabinol: NOT DETECTED

## 2013-11-17 LAB — LIPASE, BLOOD: LIPASE: 12 U/L (ref 11–59)

## 2013-11-17 LAB — POC URINE PREG, ED: PREG TEST UR: NEGATIVE

## 2013-11-17 LAB — TSH: TSH: 1.38 u[IU]/mL (ref 0.350–4.500)

## 2013-11-17 LAB — T4, FREE: FREE T4: 0.81 ng/dL (ref 0.80–1.80)

## 2013-11-17 MED ORDER — HYDROMORPHONE HCL 1 MG/ML IJ SOLN
1.0000 mg | Freq: Once | INTRAMUSCULAR | Status: AC
  Administered 2013-11-17: 1 mg via INTRAVENOUS
  Filled 2013-11-17: qty 1

## 2013-11-17 MED ORDER — HYDROMORPHONE HCL 1 MG/ML IJ SOLN
0.5000 mg | Freq: Once | INTRAMUSCULAR | Status: AC
  Administered 2013-11-17: 0.5 mg via INTRAVENOUS
  Filled 2013-11-17: qty 1

## 2013-11-17 MED ORDER — LORATADINE 10 MG PO TABS
10.0000 mg | ORAL_TABLET | Freq: Every day | ORAL | Status: DC
  Administered 2013-11-17 – 2013-11-22 (×5): 10 mg via ORAL
  Filled 2013-11-17 (×6): qty 1

## 2013-11-17 MED ORDER — IOHEXOL 300 MG/ML  SOLN
25.0000 mL | INTRAMUSCULAR | Status: AC
  Administered 2013-11-17 (×2): 25 mL via ORAL

## 2013-11-17 MED ORDER — HYDROCODONE-ACETAMINOPHEN 5-325 MG PO TABS
1.0000 | ORAL_TABLET | Freq: Two times a day (BID) | ORAL | Status: DC | PRN
  Administered 2013-11-17 – 2013-11-18 (×2): 1 via ORAL
  Filled 2013-11-17 (×2): qty 1

## 2013-11-17 MED ORDER — ONDANSETRON HCL 4 MG/2ML IJ SOLN: 4.0000 mg | Freq: Four times a day (QID) | INTRAMUSCULAR | Status: DC | PRN

## 2013-11-17 MED ORDER — NITROGLYCERIN 0.4 MG SL SUBL
SUBLINGUAL_TABLET | SUBLINGUAL | Status: AC
  Administered 2013-11-17: 0.4 mg
  Filled 2013-11-17: qty 1

## 2013-11-17 MED ORDER — IBUPROFEN 600 MG PO TABS
600.0000 mg | ORAL_TABLET | Freq: Three times a day (TID) | ORAL | Status: DC
  Administered 2013-11-17 – 2013-11-19 (×7): 600 mg via ORAL
  Filled 2013-11-17 (×2): qty 1
  Filled 2013-11-17: qty 3
  Filled 2013-11-17 (×6): qty 1

## 2013-11-17 MED ORDER — HEPARIN SODIUM (PORCINE) 5000 UNIT/ML IJ SOLN
5000.0000 [IU] | Freq: Three times a day (TID) | INTRAMUSCULAR | Status: DC
  Administered 2013-11-17 – 2013-11-23 (×17): 5000 [IU] via SUBCUTANEOUS
  Filled 2013-11-17 (×21): qty 1

## 2013-11-17 MED ORDER — GI COCKTAIL ~~LOC~~
30.0000 mL | Freq: Once | ORAL | Status: AC
  Administered 2013-11-17: 30 mL via ORAL
  Filled 2013-11-17: qty 30

## 2013-11-17 MED ORDER — POLYETHYLENE GLYCOL 3350 17 G PO PACK
17.0000 g | PACK | Freq: Every day | ORAL | Status: DC
  Administered 2013-11-17 – 2013-11-22 (×4): 17 g via ORAL
  Filled 2013-11-17 (×6): qty 1

## 2013-11-17 MED ORDER — IOHEXOL 350 MG/ML SOLN
100.0000 mL | Freq: Once | INTRAVENOUS | Status: AC | PRN
  Administered 2013-11-17: 80 mL via INTRAVENOUS

## 2013-11-17 MED ORDER — MORPHINE SULFATE 2 MG/ML IJ SOLN
1.0000 mg | INTRAMUSCULAR | Status: DC | PRN
  Administered 2013-11-17 – 2013-11-18 (×2): 1 mg via INTRAVENOUS
  Filled 2013-11-17 (×2): qty 1

## 2013-11-17 MED ORDER — ASPIRIN EC 325 MG PO TBEC
325.0000 mg | DELAYED_RELEASE_TABLET | Freq: Once | ORAL | Status: AC
  Administered 2013-11-17: 325 mg via ORAL
  Filled 2013-11-17: qty 1

## 2013-11-17 MED ORDER — FUROSEMIDE 10 MG/ML IJ SOLN
20.0000 mg | Freq: Once | INTRAMUSCULAR | Status: AC
  Administered 2013-11-17: 20 mg via INTRAVENOUS
  Filled 2013-11-17: qty 2

## 2013-11-17 MED ORDER — SODIUM CHLORIDE 0.9 % IV SOLN
INTRAVENOUS | Status: AC
  Administered 2013-11-17: 19:00:00 via INTRAVENOUS

## 2013-11-17 MED ORDER — ACETAMINOPHEN 325 MG PO TABS: 650.0000 mg | ORAL_TABLET | ORAL | Status: DC | PRN

## 2013-11-17 NOTE — H&P (Signed)
Date: 11/14/2013               Patient Name:  Courtney Cortez MRN: [id number]  DOB: 12/12/1975 Age / Sex: 38 y.o., female   PCP: No Pcp Per Patient         Medical Service: Internal Medicine Teaching Service         Attending Physician: Dr. Inez CatalinaEmily B Mullen, MD    First Contact: Dr. Heywood Ilesushil Patel Pager: [phone]  Second Contact: Dr. Carlynn PurlErik Hoffman Pager: [phone]       After Hours (After 5p/  First Contact Pager: [phone]  weekends / holidays): Second Contact Pager: [phone]   Chief Complaint: chest pain, shortness of breath  History of Present Illness: Ms. Courtney Cortez is a 38 year old obese female with GERD, HTN who presents today with 2-day history of chest pain and shortness.of breath.   Two days ago (10/20), she experienced a sudden onset of this pain while at church listening to a lecture. It was located under her breasts with radiation to the back, and she had never experienced it before. She went to the ER where CTA was unremarkable for PE or pericardial effusion. She received Toradol but said that her symptoms did not improve. Overnight, she could not sleep which prompted her to come in today.   She reports loss of appetite since the pain began but denies headache, LOC, problems with passing stool/gas, dysuria.   In the ED, CXR showed mild bilateral interstitial prominence. BNP was 577, compared with 63 from two days prior. CTA was ordered, and cardiology was consulted for further management.   Meds: Current Facility-Administered Medications  Medication Dose Route Frequency Provider Last Rate Last Dose  . ibuprofen (ADVIL,MOTRIN) tablet 600 mg  600 mg Oral TID Ky BarbanSolianny D Kennerly, MD       Current Outpatient Prescriptions  Medication Sig Dispense Refill  . aspirin EC 325 MG tablet Take 325 mg by mouth once.      . hydrochlorothiazide (HYDRODIURIL) 25 MG tablet Take 25 mg by mouth daily.      Marland Kitchen. ibuprofen (ADVIL,MOTRIN) 200 MG tablet Take 400 mg by mouth every 6 (six) hours as needed for  pain.      Marland Kitchen. loratadine (CLARITIN) 10 MG tablet Take 10 mg by mouth daily.      . meloxicam (MOBIC) 7.5 MG tablet Take 7.5 mg by mouth daily.      Marland Kitchen. HYDROcodone-acetaminophen (NORCO/VICODIN) 5-325 MG per tablet Take 1 tablet by mouth 2 (two) times daily as needed for severe pain.  6 tablet  0    Allergies: Allergies as of 11/05/2013  . (No Known Allergies)   Past Medical History  Diagnosis Date  . Acid reflux disease   . Hypertension    Past Surgical History  Procedure Laterality Date  . Cesarean section     Family History  Problem Relation Age of Onset  . Hypertension Mother   . Diabetes Father   . Hypertension Father    History   Social History  . Marital Status: Single    Spouse Name: N/A    Number of Children: N/A  . Years of Education: N/A   Occupational History  . Not on file.   Social History Main Topics  . Smoking status: Never Smoker   . Smokeless tobacco: Never Used  . Alcohol Use: Yes     Comment: shot of liquor every three weeks  . Drug Use: No  . Sexual Activity: Yes    Birth  Control/ Protection: Other-see comments   Other Topics Concern  . Not on file   Social History Narrative  . No narrative on file    Review of Systems: Chest pain, shortness of breath, loss of appetite  Denies headache, LOC, constipation, dysuria   Physical Exam: Blood pressure 129/89, pulse 47, temperature 97.2 F (36.2 C), temperature source Oral, resp. rate 20, height 5\' 8"  (1.727 m), weight 238 lb (107.956 kg), SpO2 76.00%.  General: sitting in bed, breathing shallow HEENT: PERRL, EOMI, no scleral icterus Cardiac: tachycardic but regular, no rubs, murmurs or gallops Pulm: clear to auscultation bilaterally, no wheezes, rales, or rhonchi Abd: tenderness to palpation in RUQ & RLQ > LLQ, no CVA tenderness bilaterally Ext: warm and well perfused, no pedal edema MSK: pain with palpation of sternum Neuro: alert and oriented X3, cranial nerves II-XII grossly intact,  strength and sensation to light touch equal in bilateral upper and lower extremities   Lab results: Basic Metabolic Panel:  Recent Labs  16/10/96 2351 11/04/2013 1022  NA 131* 133*  K 3.6* 3.4*  CL 98 97  CO2 24 24  GLUCOSE 107* 117*  BUN 8 16  CREATININE 0.71 0.93  CALCIUM 9.1 9.0   CBC:  Recent Labs  11/15/13 2351 11/05/2013 1022  WBC 6.6 8.6  NEUTROABS  --  7.3  HGB 9.4* 10.0*  HCT 28.1* 30.2*  MCV 87.3 87.0  PLT 318 289   Cardiac Enzymes:  Recent Labs  11/20/2013 1022  TROPONINI <0.30   BNP:  Recent Labs  11/15/13 2351 11/19/2013 1022  PROBNP 63.1 577.2*   Imaging results:  Dg Chest 2 View  11/14/2013   CLINICAL DATA:  Chest pain, shortness of breath  EXAM: CHEST  2 VIEW  COMPARISON:  CT chest 11/16/2013  FINDINGS: There is bibasilar atelectasis. There is bilateral mild interstitial thickening. There is no focal parenchymal opacity, pleural effusion, or pneumothorax. The heart and mediastinal contours are unremarkable.  The osseous structures are unremarkable.  IMPRESSION: Mild bibasilar atelectasis. Mild bilateral interstitial prominence with may reflect mild interstitial edema.   Electronically Signed   By: Elige Ko   On: 11/15/2013 08:42   Dg Chest 2 View  11/16/2013   CLINICAL DATA:  High blood pressure. Pain across mid chest. Chest tightness. Shortness of breath.  EXAM: CHEST  2 VIEW  COMPARISON:  None.  FINDINGS: Shallow inspiration. Borderline heart size with normal pulmonary vascularity. Atelectasis in the lung bases. No focal consolidation in the lungs. No blunting of costophrenic angles. No pneumothorax. Mediastinal contours appear intact.  IMPRESSION: Shallow inspiration with linear atelectasis in the lung bases.   Electronically Signed   By: Burman Nieves M.D.   On: 11/16/2013 00:22   Ct Angio Chest Pe W/cm &/or Wo Cm  11/16/2013   CLINICAL DATA:  Shortness of breath and chest pain since 8 p.m. last night.  EXAM: CT ANGIOGRAPHY CHEST WITH  CONTRAST  TECHNIQUE: Multidetector CT imaging of the chest was performed using the standard protocol during bolus administration of intravenous contrast. Multiplanar CT image reconstructions and MIPs were obtained to evaluate the vascular anatomy.  CONTRAST:  OMNIPAQUE IOHEXOL 350 MG/ML SOLN  COMPARISON:  None.  FINDINGS: Technically adequate study with good opacification of the central and segmental pulmonary arteries. No focal filling defects demonstrated. No evidence of significant pulmonary embolus.  Mild cardiac enlargement. Normal caliber thoracic aorta without evidence of dissection. Increased density in the anterior mediastinum is probably due to residual thymic tissue. Esophagus  is decompressed. Mediastinal lymph nodes are not pathologically enlarged.  Evaluation of lungs is limited due to respiratory motion artifact but there appears to be atelectasis in both lung bases. No pleural effusion. No pneumothorax. No destructive bone lesions appreciated.  Included portions of the upper abdominal organs are grossly unremarkable.  Review of the MIP images confirms the above findings.  IMPRESSION: No evidence of significant pulmonary embolus. Atelectasis in the lung bases.   Electronically Signed   By: Burman NievesWilliam  Stevens M.D.   On: 11/16/2013 03:09   Assessment & Plan by Problem: Principal Problem:   Pleuritic chest pain Active Problems:   GERD (gastroesophageal reflux disease)   Benign essential HTN   Dyspnea  Ms. Courtney Cortez is a 38 year old obese female with GERD, HTN who presents today with 2-day history of chest pain and dyspnea found to have normocytic anemia and hypokalemia.   Chest pain, dyspnea: Differential initially included cardiac etiologies, like heart failure, pericarditis, MI. Heart failure less likely given physical exam findings, echo, and only a mild elevation in BNP. MI less likely troponins unremarkable x 2 and non-specific EKG changes. Pericarditis less likely given echo findings and  lack of response to Toradol. Gastric etiologies possible given her history of GERD and abdominal pain, like pancreatitis, cholecystitis, PUD. Gyn pathologies also possible given her chronic normocytic anemia.  -Check LFTs, lipase & Ab CT -Continue home Norco for pain control -Reassess for gyn pathology  Hypokalemia: K 3.4, down from 3.6 two days ago. Etiology unclear.  -Continue following  GERD: Continue home meds.   HTN: Holding home meds.  #FEN:  -Diet: Heart Healthy  #DVT prophylaxis: heparin 5000 units subcutaneous  #CODE STATUS: FULL CODE  Dispo: Disposition is deferred at this time, awaiting improvement of current medical problems. Anticipated discharge in approximately 1-2 day(s).   The patient does have a current PCP (No Pcp Per Patient) and does not [facility] after discharge.  The patient does not know have transportation limitations that hinder transportation to clinic appointments.  Signed: Heywood Ilesushil Patel, MD 11-30-13, 2:34 PM

## 2013-11-17 NOTE — ED Notes (Signed)
2E Echo at bedside.

## 2013-11-17 NOTE — Consult Note (Signed)
Admit date: 11/01/2013 Referring Physician  Dr. Littie Deeds Primary Physician  None Primary Cardiologist  None Reason for Consultation  Chest pain and SOB  HPI: This is Courtney Cortez with a history of GERD and HTN who presented today for evaluation chest pain and SOB.  She started having pleuritic CP on Tuesday along with SOB.  This occurred abruptly and she was seen in the ER on 10/21 with complaints of chest pain under her breasts with radiation to her back.  This occurred while sitting in church and was sudden in onset.  The pain was worse when sitting down, with deep breathing or with walking.  It has been constant since Tuesday.  In ER that day a Chest CT angio showed no evidence of PE or pericardial effusion.  She was given Toradol in the ER and her pain significantly improved. Today she presented back to the ER due to persistence of severe CP now to the point she can barely breathe in.  She is SOB sitting talking to her.  She is a nonsmoker and denies any recent long distance travel, OCP use.  Chest xray shows mild bilateral interstitial prominence ? Interstitial edema and BNP is mildly elevated at 500.  Troponin is normal.  Cardiology is now asked to consult for further evaluation.     PMH:   Past Medical History  Diagnosis Date  . Acid reflux disease   . Hypertension      PSH:   Past Surgical History  Procedure Laterality Date  . Cesarean section      Allergies:  Review of patient's allergies indicates no known allergies. Prior to Admit Meds:   (Not in a hospital admission) Fam HX:    Family History  Problem Relation Age of Onset  . Hypertension Mother   . Diabetes Father   . Hypertension Father    Social HX:    History   Social History  . Marital Status: Single    Spouse Name: N/A    Number of Children: N/A  . Years of Education: N/A   Occupational History  . Not on file.   Social History Main Topics  . Smoking status: Never Smoker   . Smokeless tobacco: Never  Used  . Alcohol Use: Yes     Comment: shot of liquor every three weeks  . Drug Use: No  . Sexual Activity: Yes    Birth Control/ Protection: Other-see comments   Other Topics Concern  . Not on file   Social History Narrative  . No narrative on file     ROS:  All 11 ROS were addressed and are negative except what is stated in the HPI  Physical Exam: Blood pressure 134/84, pulse 47, temperature 97.2 F (36.2 C), temperature source Oral, resp. rate 34, height 5\' 8"  (1.727 m), weight 238 lb (107.956 kg), SpO2 76.00%.    General: Well developed, well nourished, in moderate distress secondary to severe pleuritic CP with SOB Head: Eyes PERRLA, No xanthomas.   Normal cephalic and atramatic  Lungs:   Clear bilaterally to auscultation but she is not taking in deep breaths due to sharp pain with inspiration Heart:   HRRR tachycardic S1 S2 Pulses are 2+ & equal.            No carotid bruit. No JVD.  No abdominal bruits. No femoral bruits. Abdomen: Bowel sounds are positive, abdomen soft and non-tender without masses Extremities:   No clubbing, cyanosis or edema.  DP +1  Neuro: Alert and oriented X 3. Psych:  Good affect, responds appropriately    Labs:   Lab Results  Component Value Date   WBC 8.6 11/16/2013   HGB 10.0* 10/30/2013   HCT 30.2* 11/18/2013   MCV 87.0 11/10/2013   PLT 289 11/05/2013    Recent Labs Lab 11/02/2013 1022  NA 133*  K 3.4*  CL 97  CO2 24  BUN 16  CREATININE 0.93  CALCIUM 9.0  GLUCOSE 117*   No results found for this basename: PTT   No results found for this basename: INR, PROTIME   Lab Results  Component Value Date   TROPONINI <0.30 11/23/2013     No results found for this basename: CHOL   No results found for this basename: HDL   No results found for this basename: LDLCALC   No results found for this basename: TRIG   No results found for this basename: CHOLHDL   No results found for this basename: LDLDIRECT      Radiology:  Dg  Chest 2 View  10/29/2013   CLINICAL DATA:  Chest pain, shortness of breath  EXAM: CHEST  2 VIEW  COMPARISON:  CT chest 11/16/2013  FINDINGS: There is bibasilar atelectasis. There is bilateral mild interstitial thickening. There is no focal parenchymal opacity, pleural effusion, or pneumothorax. The heart and mediastinal contours are unremarkable.  The osseous structures are unremarkable.  IMPRESSION: Mild bibasilar atelectasis. Mild bilateral interstitial prominence with may reflect mild interstitial edema.   Electronically Signed   By: Elige KoHetal  Patel   On: 10/31/2013 08:42   Dg Chest 2 View  11/16/2013   CLINICAL DATA:  High blood pressure. Pain across mid chest. Chest tightness. Shortness of breath.  EXAM: CHEST  2 VIEW  COMPARISON:  None.  FINDINGS: Shallow inspiration. Borderline heart size with normal pulmonary vascularity. Atelectasis in the lung bases. No focal consolidation in the lungs. No blunting of costophrenic angles. No pneumothorax. Mediastinal contours appear intact.  IMPRESSION: Shallow inspiration with linear atelectasis in the lung bases.   Electronically Signed   By: Burman NievesWilliam  Stevens M.D.   On: 11/16/2013 00:22   Ct Angio Chest Pe W/cm &/or Wo Cm  11/16/2013   CLINICAL DATA:  Shortness of breath and chest pain since 8 p.m. last night.  EXAM: CT ANGIOGRAPHY CHEST WITH CONTRAST  TECHNIQUE: Multidetector CT imaging of the chest was performed using the standard protocol during bolus administration of intravenous contrast. Multiplanar CT image reconstructions and MIPs were obtained to evaluate the vascular anatomy.  CONTRAST:  100mL OMNIPAQUE IOHEXOL 350 MG/ML SOLN  COMPARISON:  None.  FINDINGS: Technically adequate study with good opacification of the central and segmental pulmonary arteries. No focal filling defects demonstrated. No evidence of significant pulmonary embolus.  Mild cardiac enlargement. Normal caliber thoracic aorta without evidence of dissection. Increased density in the  anterior mediastinum is probably due to residual thymic tissue. Esophagus is decompressed. Mediastinal lymph nodes are not pathologically enlarged.  Evaluation of lungs is limited due to respiratory motion artifact but there appears to be atelectasis in both lung bases. No pleural effusion. No pneumothorax. No destructive bone lesions appreciated.  Included portions of the upper abdominal organs are grossly unremarkable.  Review of the MIP images confirms the above findings.  IMPRESSION: No evidence of significant pulmonary embolus. Atelectasis in the lung bases.   Electronically Signed   By: Burman NievesWilliam  Stevens M.D.   On: 11/16/2013 03:09    EKG:  10/21 sinus tachycardia with diffuse T  wave inversions.  10/22 NSR with no ST changes - previous T wave changes have resolved  ASSESSMENT:  1.  Severe pleuritic CP constant for 3 days and improved after Toradol in the ER on 10/21.  Chest CT 10/21 negative for acute PE.  I am concerned that she still may have a PE given the severity of her SOB and pleuritic CP to the point that she cannot take in a deep breath and the sudden onset.  She is very SOB on exam but O2 sats are stable on 2L [place].  Her symptoms could also be from acute pericarditis since the CP did improve with Toradol and she has had some dynamic T wave changes when comparing her EKGs since 10/20.  She is not a smoker and has no risk factors for PE other than obesity.  Her initial troponin is normal. 2.  Acute and persistent SOB with chest xray showing mild interstitial prominence that may represent edema.  Her BNP is mildly elevated but her SOB appears out of proportion to her degree of BNP elevation or chest xray findings.  Her Chest CT on 10/21 did not show any edema.   3.  GERD 4.  HTN 5.  Elevated BNP ? Significance - this can be elevated in acute PE.  She may have some diastolic CHF.  Will give one dose of IV lasix  PLAN:   1.  Cycle cardiac enzymes 2.  Repeat Chest CT angio to rule out PE or  consider VQ scan 3.  2D echo to assess for pericardial effusion, RV dilatation that can be seen in PE and LVF 4.  Treat with NSAIDS for now - Ibuprofen 600mg  TID for possible pericarditis and consider adding Colchicine 5.  Will give one dose of IV lasix   Quintella ReichertURNER,TRACI R, MD  Apr 05, 2013  1:18 PM

## 2013-11-17 NOTE — ED Notes (Signed)
Pt continues to c/o chest and back pain with respirations-- pulse ox not reading-- moved to right hand-- reading 88-92% on room air, placed on 2L/M/[place] -- Dr. Littie DeedsGentry notified.

## 2013-11-17 NOTE — Progress Notes (Addendum)
[id number] report received from Ble,ED RN 1530 to 3E 09 from ED via stretcher pt  Is fully awake,alert and orienrted .Pt has difficulty pf breathing wirh pain  On deep exertion on the anterio chst to back  CMT aware of admision

## 2013-11-17 NOTE — ED Notes (Signed)
Pt states she feels short of breath Clydie BraunKaren, RN and Lawson FiscalLori, RN notified

## 2013-11-17 NOTE — ED Notes (Signed)
Pt. Very tearful having chest pain , generalized pain into her back.  Reported to Dr. Littie DeedsGentry. Pain is a 10/10.  Orders received to give Nitro. 1 SL

## 2013-11-17 NOTE — Progress Notes (Signed)
  Echocardiogram 2D Echocardiogram has been performed.  Courtney Cortez [name] 11/18/2013, 3:08 PM

## 2013-11-17 NOTE — ED Notes (Signed)
Updated pt. On plan of care and informed her that Cardiology will be coming to see her.  She is resting continues to have chest pain with inspiration.  Pt. Is in NAD. Dr. Littie DeedsGentry is aware.

## 2013-11-17 NOTE — ED Provider Notes (Signed)
CSN: [id number]     Arrival date & time 11/02/2013  [id number] History   First MD Initiated Contact with Patient 11/09/2013 [phone]     Chief Complaint  Patient presents with  . Shortness of Breath     (Consider location/radiation/quality/duration/timing/severity/associated sxs/prior Treatment) Patient is a 38 y.o. female presenting with shortness of breath.  Shortness of Breath Severity:  Moderate Onset quality:  Gradual Duration:  4 days Timing:  Constant Progression:  Unchanged Chronicity:  New Context: not URI   Relieved by:  Nothing Worsened by:  Eating Ineffective treatments:  None tried Associated symptoms: abdominal pain and chest pain (intermittent with eating)   Associated symptoms: no cough, no fever and no vomiting     Past Medical History  Diagnosis Date  . Acid reflux disease   . Hypertension   . Arthritis     "joints"   Past Surgical History  Procedure Laterality Date  . Cesarean section  1997   Family History  Problem Relation Age of Onset  . Hypertension Mother   . Diabetes Father   . Hypertension Father    History  Substance Use Topics  . Smoking status: Never Smoker   . Smokeless tobacco: Never Used  . Alcohol Use: Yes     Comment: 11/16/2013 "haven't drank since 2011"   OB History   Grav Para Term Preterm Abortions TAB SAB Ect Mult Living   4 4 4             Review of Systems  Constitutional: Negative for fever.  Respiratory: Positive for shortness of breath. Negative for cough.   Cardiovascular: Positive for chest pain (intermittent with eating).  Gastrointestinal: Positive for abdominal pain. Negative for vomiting.  All other systems reviewed and are negative.     Allergies  Review of patient's allergies indicates no known allergies.  Home Medications   Prior to Admission medications   Medication Sig Start Date End Date Taking? Authorizing Provider  aspirin EC 325 MG tablet Take 325 mg by mouth once.   Yes Historical Provider, MD   hydrochlorothiazide (HYDRODIURIL) 25 MG tablet Take 25 mg by mouth daily.   Yes Historical Provider, MD  ibuprofen (ADVIL,MOTRIN) 200 MG tablet Take 400 mg by mouth every 6 (six) hours as needed for pain.   Yes Historical Provider, MD  loratadine (CLARITIN) 10 MG tablet Take 10 mg by mouth daily.   Yes Historical Provider, MD  meloxicam (MOBIC) 7.5 MG tablet Take 7.5 mg by mouth daily.   Yes Historical Provider, MD  HYDROcodone-acetaminophen (NORCO/VICODIN) 5-325 MG per tablet Take 1 tablet by mouth 2 (two) times daily as needed for severe pain. 11/16/13   Tomasita CrumbleAdeleke Oni, MD   BP 113/77  Pulse 107  Temp(Src) 97.8 F (36.6 C) (Oral)  Resp 20  Ht 5\' 8"  (1.727 m)  Wt 234 lb 12.6 oz (106.5 kg)  BMI 35.71 kg/m2  SpO2 96% Physical Exam  Vitals reviewed. Constitutional: She is oriented to person, place, and time. She appears well-developed and well-nourished.  HENT:  Head: Normocephalic and atraumatic.  Right Ear: External ear normal.  Left Ear: External ear normal.  Eyes: Conjunctivae and EOM are normal. Pupils are equal, round, and reactive to light.  Neck: Normal range of motion. Neck supple.  Cardiovascular: Normal rate, regular rhythm, normal heart sounds and intact distal pulses.   Pulmonary/Chest: Effort normal and breath sounds normal.  Abdominal: Soft. Bowel sounds are normal. There is tenderness in the epigastric area and left upper quadrant.  Musculoskeletal: Normal range of motion.  Neurological: She is alert and oriented to person, place, and time.  Skin: Skin is warm and dry.    ED Course  Procedures (including critical care time) Labs Review Labs Reviewed  CBC WITH DIFFERENTIAL - Abnormal; Notable for the following:    RBC 3.47 (*)    Hemoglobin 10.0 (*)    HCT 30.2 (*)    Neutrophils Relative % 84 (*)    Lymphocytes Relative 11 (*)    All other components within normal limits  BASIC METABOLIC PANEL - Abnormal; Notable for the following:    Sodium 133 (*)     Potassium 3.4 (*)    Glucose, Bld 117 (*)    GFR calc non Af Amer 78 (*)    GFR calc Af Amer 90 (*)    All other components within normal limits  PRO B NATRIURETIC PEPTIDE - Abnormal; Notable for the following:    Pro B Natriuretic peptide (BNP) 577.2 (*)    All other components within normal limits  URINE RAPID DRUG SCREEN (HOSP PERFORMED) - Abnormal; Notable for the following:    Barbiturates POSITIVE (*)    All other components within normal limits  HEPATIC FUNCTION PANEL - Abnormal; Notable for the following:    Total Protein 11.0 (*)    Albumin 2.9 (*)    All other components within normal limits  TROPONIN I  TSH  T4, FREE  TROPONIN I  TROPONIN I  TROPONIN I  LIPASE, BLOOD  URINALYSIS, ROUTINE W REFLEX MICROSCOPIC  BASIC METABOLIC PANEL  CBC  POC URINE PREG, ED    Imaging Review Ct Abdomen Pelvis Wo Contrast  11/05/2013   CLINICAL DATA:  38 year old female with acute shortness of breath and abdominal pain. Initial encounter.  EXAM: CT ABDOMEN AND PELVIS WITHOUT CONTRAST  TECHNIQUE: Multidetector CT imaging of the abdomen and pelvis was performed following the standard protocol without IV contrast.  COMPARISON:  Abdomen ultrasound 12/29/2010 and earlier. Chest CTA 1431 hr today.  FINDINGS: Stable lung bases from earlier today, see that report.  No acute osseous abnormality identified.  Excreted IV contrast in the urinary bladder. No pelvic free fluid identified. Negative non contrast uterus and adnexa. Negative distal colon except for redundancy.  Left colon, transverse colon, right colon and appendix are normal. No dilated small bowel. Oral contrast in the stomach and proximal small bowel loops. Negative duodenum.  Vicarious contrast excretion to the gallbladder. Negative non contrast liver, spleen, pancreas, adrenal glands, and kidneys (symmetric renal contrast excretion). No abdominal free fluid. Mild subcutaneous stranding along both flanks. No lymphadenopathy identified in the  abdomen or pelvis in the absence of IV contrast.  IMPRESSION: 1. No acute or inflammatory finding identified in the abdomen or pelvis. 2. Stable abnormal lung bases from chest CTA earlier today (please see that report).   Electronically Signed   By: Augusto GambleLee  Hall M.D.   On: 11/06/2013 22:50   Dg Chest 2 View  10/29/2013   CLINICAL DATA:  Chest pain, shortness of breath  EXAM: CHEST  2 VIEW  COMPARISON:  CT chest 11/16/2013  FINDINGS: There is bibasilar atelectasis. There is bilateral mild interstitial thickening. There is no focal parenchymal opacity, pleural effusion, or pneumothorax. The heart and mediastinal contours are unremarkable.  The osseous structures are unremarkable.  IMPRESSION: Mild bibasilar atelectasis. Mild bilateral interstitial prominence with may reflect mild interstitial edema.   Electronically Signed   By: Elige KoHetal  Patel   On: 11/15/2013 08:42  Ct Angio Chest W/cm &/or Wo Cm  11/13/2013   CLINICAL DATA:  Shortness of Breath imaged intermittent chest pain. Symptoms since 11/15/2013.  EXAM: CT ANGIOGRAPHY CHEST WITH CONTRAST  TECHNIQUE: Multidetector CT imaging of the chest was performed using the standard protocol during bolus administration of intravenous contrast. Multiplanar CT image reconstructions and MIPs were obtained to evaluate the vascular anatomy.  CONTRAST:  80 mL OMNIPAQUE IOHEXOL 350 MG/ML SOLN  COMPARISON:  CT chest 11/16/2013.  FINDINGS: The study is limited by bolus timing. No pulmonary embolus is identified. The patient has a new small right pleural effusion. There is no left pleural effusion or pericardial effusion. Cardiomegaly is identified as on the prior study. Multiple small bilateral axillary lymph nodes are unchanged. No hilar or mediastinal lymphadenopathy is identified.  Right much worse than left basilar airspace disease has markedly worsened since the prior examination. Multiple air bronchograms are seen in the right base. Visualized upper abdomen demonstrates no  focal abnormality. No focal bony abnormality is identified.  Review of the MIP images confirms the above findings.  IMPRESSION: Right worse than left basilar airspace disease has markedly worsened since the prior study and could be due to atelectasis, pneumonia or less likely aspiration. New small right pleural effusion is identified.  Negative for pulmonary embolus.  Cardiomegaly.   Electronically Signed   By: Drusilla Kanner M.D.   On: 11/12/2013 15:09     EKG Interpretation   Date/Time:  Thursday November 17 2013 08:23:46 EDT Ventricular Rate:  88 PR Interval:  142 QRS Duration: 90 QT Interval:  337 QTC Calculation: 408 R Axis:   44 Text Interpretation:  Sinus rhythm Borderline T wave abnormalities  Baseline wander in lead(s) V5 No significant change was found Confirmed by  Mirian Mo [phone]) on 11/22/2013 9:25:32 AM      MDM   Final diagnoses:  Benign essential HTN  Elevated brain natriuretic peptide (BNP) level  Gastroesophageal reflux disease without esophagitis  Pleuritic chest pain  SOB (shortness of breath)  Abdominal pain    39 y.o. female with pertinent PMH of GERD, HTN presents with continued dyspnea.  Patient was seen approximately 24 hours ago for identical symptoms, at that time had a workup including CT scan of her chest and delta troponin which were unremarkable. On arrival today patient complains of chest pain, however this is primarily when swallowing and eating and is only minimal on my initial examination.  She complains of sore throat. She states that she is short of breath, has lung sounds clear to auscultation bilaterally. Her axillary temperature is 99.2.  Labs, CXR, EKG as above.  New onset elevation in bnp.  Unfortunately pt began to have increased pleurisy and tachypnea.  Consulted cardiology who felt medical admission warranted and recommended CT PE study.  Consulted medicine for admission.  1. Benign essential HTN   2. Elevated brain natriuretic  peptide (BNP) level   3. Gastroesophageal reflux disease without esophagitis   4. Pleuritic chest pain   5. SOB (shortness of breath)   6. Abdominal pain         Mirian Mo, MD 11/18/13 [phone]

## 2013-11-17 NOTE — ED Notes (Signed)
Seen mon for SOB and CP, returns for SOB and difficulty swallowing, reports intermitent CP, no F/V/D, 650 ASA pta, A/O X4, ambulatory and in NAD

## 2013-11-18 DIAGNOSIS — M6282 Rhabdomyolysis: Secondary | ICD-10-CM

## 2013-11-18 DIAGNOSIS — I319 Disease of pericardium, unspecified: Secondary | ICD-10-CM | POA: Diagnosis present

## 2013-11-18 LAB — BASIC METABOLIC PANEL
Anion gap: 16 — ABNORMAL HIGH (ref 5–15)
Anion gap: 14 (ref 5–15)
BUN: 25 mg/dL — ABNORMAL HIGH (ref 6–23)
BUN: 28 mg/dL — ABNORMAL HIGH (ref 6–23)
CALCIUM: 9.1 mg/dL (ref 8.4–10.5)
CHLORIDE: 94 meq/L — AB (ref 96–112)
CO2: 22 mEq/L (ref 19–32)
CO2: 23 mEq/L (ref 19–32)
CREATININE: 1.23 mg/dL — AB (ref 0.50–1.10)
Calcium: 8.7 mg/dL (ref 8.4–10.5)
Chloride: 96 mEq/L (ref 96–112)
Creatinine, Ser: 1.25 mg/dL — ABNORMAL HIGH (ref 0.50–1.10)
GFR calc Af Amer: 64 mL/min — ABNORMAL LOW (ref >90)
GFR calc Af Amer: 63 mL/min — ABNORMAL LOW (ref >90)
GFR calc non Af Amer: 55 mL/min — ABNORMAL LOW (ref >90)
GFR calc non Af Amer: 54 mL/min — ABNORMAL LOW (ref >90)
GLUCOSE: 140 mg/dL — AB (ref 70–99)
Glucose, Bld: 134 mg/dL — ABNORMAL HIGH (ref 70–99)
POTASSIUM: 3.6 meq/L — AB (ref 3.7–5.3)
POTASSIUM: 3.7 meq/L (ref 3.7–5.3)
SODIUM: 133 meq/L — AB (ref 137–147)
Sodium: 132 mEq/L — ABNORMAL LOW (ref 137–147)

## 2013-11-18 LAB — URINALYSIS, ROUTINE W REFLEX MICROSCOPIC
Bilirubin Urine: NEGATIVE
GLUCOSE, UA: NEGATIVE mg/dL
Glucose, UA: NEGATIVE mg/dL
KETONES UR: 15 mg/dL — AB
KETONES UR: NEGATIVE mg/dL
LEUKOCYTES UA: NEGATIVE
LEUKOCYTES UA: NEGATIVE
NITRITE: NEGATIVE
Nitrite: NEGATIVE
PH: 5 (ref 5.0–8.0)
Protein, ur: >300 mg/dL — AB
Protein, ur: 100 mg/dL — AB
SPECIFIC GRAVITY, URINE: 1.025 (ref 1.005–1.030)
Specific Gravity, Urine: 1.026 (ref 1.005–1.030)
Urobilinogen, UA: 1 mg/dL (ref 0.0–1.0)
Urobilinogen, UA: 1 mg/dL (ref 0.0–1.0)
pH: 5.5 (ref 5.0–8.0)

## 2013-11-18 LAB — CBC
HCT: 29.8 % — ABNORMAL LOW (ref 36.0–46.0)
HEMOGLOBIN: 10 g/dL — AB (ref 12.0–15.0)
MCH: 29 pg (ref 26.0–34.0)
MCHC: 33.6 g/dL (ref 30.0–36.0)
MCV: 86.4 fL (ref 78.0–100.0)
Platelets: 288 10*3/uL (ref 150–400)
RBC: 3.45 MIL/uL — ABNORMAL LOW (ref 3.87–5.11)
RDW: 14.6 % (ref 11.5–15.5)
WBC: 6.2 10*3/uL (ref 4.0–10.5)

## 2013-11-18 LAB — URINE MICROSCOPIC-ADD ON

## 2013-11-18 LAB — CK
Total CK: 492 U/L — ABNORMAL HIGH (ref 7–177)
Total CK: 482 U/L — ABNORMAL HIGH (ref 7–177)

## 2013-11-18 LAB — LACTIC ACID, PLASMA: Lactic Acid, Venous: 2.2 mmol/L (ref 0.5–2.2)

## 2013-11-18 LAB — TROPONIN I

## 2013-11-18 LAB — PHOSPHORUS: PHOSPHORUS: 3.2 mg/dL (ref 2.3–4.6)

## 2013-11-18 MED ORDER — SODIUM CHLORIDE 0.9 % IV SOLN
INTRAVENOUS | Status: AC
  Administered 2013-11-19 (×2): via INTRAVENOUS

## 2013-11-18 MED ORDER — MORPHINE SULFATE 2 MG/ML IJ SOLN
2.0000 mg | INTRAMUSCULAR | Status: DC | PRN
  Administered 2013-11-18 – 2013-11-24 (×9): 2 mg via INTRAVENOUS
  Filled 2013-11-18 (×10): qty 1

## 2013-11-18 MED ORDER — HYDROCODONE-ACETAMINOPHEN 5-325 MG PO TABS
1.0000 | ORAL_TABLET | ORAL | Status: DC | PRN
  Administered 2013-11-18: 1 via ORAL
  Administered 2013-11-19: 2 via ORAL
  Filled 2013-11-18: qty 2
  Filled 2013-11-18: qty 1

## 2013-11-18 MED ORDER — SODIUM CHLORIDE 0.9 % IV SOLN
INTRAVENOUS | Status: AC
  Administered 2013-11-18 (×2): via INTRAVENOUS

## 2013-11-18 MED ORDER — SODIUM CHLORIDE 0.9 % IV BOLUS (SEPSIS)
1000.0000 mL | Freq: Once | INTRAVENOUS | Status: AC
  Administered 2013-11-18: 1000 mL via INTRAVENOUS

## 2013-11-18 MED ORDER — DIPHENHYDRAMINE HCL 25 MG PO CAPS
25.0000 mg | ORAL_CAPSULE | Freq: Every evening | ORAL | Status: AC | PRN
  Administered 2013-11-18: 25 mg via ORAL
  Filled 2013-11-18: qty 1

## 2013-11-18 MED ORDER — KETOROLAC TROMETHAMINE 30 MG/ML IJ SOLN
30.0000 mg | Freq: Once | INTRAMUSCULAR | Status: AC
  Administered 2013-11-18: 30 mg via INTRAVENOUS
  Filled 2013-11-18 (×2): qty 1

## 2013-11-18 NOTE — H&P (Signed)
  Date: 11/18/2013  Patient name: Baldemar Lenisonya Kloehn  Medical record number: [id number]  Date of birth: 05/12/1975   I have seen and evaluated Baldemar Lenisonya Huizinga and discussed their care with the Residency Team. Briefly, [name] is a 38yo AAW who presents with 2 day history of sudden onset of chest pain which radiated to the back.  She had a CTA in the ER which did not show any PE or pericardial effusion.  She received Toradol, but noted that her symptoms did not improve.  She was unable to sleep due to the pain and this brought her in to the hospital.  She also notes muscle aches, SOB which is worsened b/c she does not feel she can take a deep breath and loss of appetite.  A repeat CT chest was done in the ED which did not reveal a PE.  Cardiology was consulted and initiated therapy for possible pericarditis (some possible subtle changes on EKG), however, this did not control her pain.  UA showed + hgb but no red cells, concerning for myoglobinuria.   Assessment and Plan: I have seen and evaluated the patient as outlined above. I agree with the formulated Assessment and Plan as detailed in the residents' admission note, with the following changes:   1. Chest pain/dyspnea - pericarditis vs. Acute rhabdomyolysis - Would repeat EKG - Repeat UA - Check CK and LA along with HIV.  CK can be elevated in pericarditis, but troponin is usually mildly elevated too.  First CE was negative - Broaden pain control, she is not being adequately managed with NSAIDs.   - Abd CT - NS IVF - Replete electrolytes - Trend Creatinine since there is concern for rhabdo.   2. Hypokalemia - Would replete and monitor.   Other issues per resident note.   Inez CatalinaEmily B Mullen, MD 10/23/20159:23 PM

## 2013-11-18 NOTE — Progress Notes (Signed)
1610 Appears sound asleep on rounds , pt called shortly after saying that pain persist on inspiration .with shallow and minimal inspiratory effort.O2 sat 90s on2l/min Made a call to Dr. Allena KatzPatel.Pain med due at [facility]6^pm

## 2013-11-18 NOTE — Progress Notes (Signed)
Pt still c/o pain mainly on her abdomen and mentioned to leave AMA. Dr Isabella BowensKrall made aware , came to see patient and was able to persuade pt to stay. New meds ordered for  Pain and noted.

## 2013-11-18 NOTE — Progress Notes (Signed)
Subjective: She was in pain this AM when we saw her though appeared a bit better and was off O2. She reported taking [employer] weight loss pills on Tuesday when she first noted her symptoms. Five months ago, she had tried Phentermine as well but has not taken it since.  Objective: Vital signs in last 24 hours: Filed Vitals:   11/18/13 0235 11/18/13 0603 11/18/13 1113 11/18/13 1431  BP: 133/65 113/77 122/77 136/95  Pulse: 128 107 117 134  Temp: 99.4 F (37.4 C) 97.8 F (36.6 C)  99.2 F (37.3 C)  TempSrc: Oral Oral  Oral  Resp: 22 20 20 22   Height:      Weight:  234 lb 12.6 oz (106.5 kg)    SpO2: 96% 96% 96% 93%   Weight change:   Intake/Output Summary (Last 24 hours) at 11/18/13 1509 Last data filed at 11/18/13 1400  Gross per 24 hour  Intake   2020 ml  Output   1000 ml  Net   1020 ml   General: sitting in bed, breathing shallow  HEENT: PERRL, EOMI, no scleral icterus  Cardiac: tachycardic but regular, no rubs, murmurs or gallops  Pulm: clear to auscultation bilaterally, no wheezes, rales, or rhonchi  Abd: tenderness to palpation in RUQ & RLQ > LLQ, no CVA tenderness bilaterally  Ext: warm and well perfused, no pedal edema  MSK: pain with palpation of sternum  Neuro: alert and oriented X3, cranial nerves II-XII grossly intact, strength and sensation to light touch equal in bilateral upper and lower extremities   Lab Results: Basic Metabolic Panel:  Recent Labs Lab 11/11/2013 1022 11/18/13 0650  NA 133* 132*  K 3.4* 3.6*  CL 97 94*  CO2 24 22  GLUCOSE 117* 134*  BUN 16 25*  CREATININE 0.93 1.23*  CALCIUM 9.0 8.7   CBC:  Recent Labs Lab 10/29/2013 1022 11/18/13 0650  WBC 8.6 6.2  NEUTROABS 7.3  --   HGB 10.0* 10.0*  HCT 30.2* 29.8*  MCV 87.0 86.4  PLT 289 288   Cardiac Enzymes:  Recent Labs Lab 11/06/2013 1333 11/16/2013 1936 11/18/13 0134 11/18/13 0650  CKTOTAL  --   --   --  492*  TROPONINI <0.30 <0.30 <0.30  --    Urinalysis:  Recent  Labs Lab 11/18/13 0640  COLORURINE AMBER*  LABSPEC 1.025  PHURINE 5.0  GLUCOSEU NEGATIVE  HGBUR MODERATE*  BILIRUBINUR SMALL*  KETONESUR 15*  PROTEINUR >300*  UROBILINOGEN 1.0  NITRITE NEGATIVE  LEUKOCYTESUR NEGATIVE   Studies/Results: Ct Abdomen Pelvis Wo Contrast  11/21/2013   CLINICAL DATA:  38 year old female with acute shortness of breath and abdominal pain. Initial encounter.  EXAM: CT ABDOMEN AND PELVIS WITHOUT CONTRAST  TECHNIQUE: Multidetector CT imaging of the abdomen and pelvis was performed following the standard protocol without IV contrast.  COMPARISON:  Abdomen ultrasound 12/29/2010 and earlier. Chest CTA 1431 hr today.  FINDINGS: Stable lung bases from earlier today, see that report.  No acute osseous abnormality identified.  Excreted IV contrast in the urinary bladder. No pelvic free fluid identified. Negative non contrast uterus and adnexa. Negative distal colon except for redundancy.  Left colon, transverse colon, right colon and appendix are normal. No dilated small bowel. Oral contrast in the stomach and proximal small bowel loops. Negative duodenum.  Vicarious contrast excretion to the gallbladder. Negative non contrast liver, spleen, pancreas, adrenal glands, and kidneys (symmetric renal contrast excretion). No abdominal free fluid. Mild subcutaneous stranding along both flanks. No lymphadenopathy identified  in the abdomen or pelvis in the absence of IV contrast.  IMPRESSION: 1. No acute or inflammatory finding identified in the abdomen or pelvis. 2. Stable abnormal lung bases from chest CTA earlier today (please see that report).   Electronically Signed   By: Augusto Gamble M.D.   On: 11/23/2013 22:50   Dg Chest 2 View  11/15/2013   CLINICAL DATA:  Chest pain, shortness of breath  EXAM: CHEST  2 VIEW  COMPARISON:  CT chest 11/16/2013  FINDINGS: There is bibasilar atelectasis. There is bilateral mild interstitial thickening. There is no focal parenchymal opacity, pleural  effusion, or pneumothorax. The heart and mediastinal contours are unremarkable.  The osseous structures are unremarkable.  IMPRESSION: Mild bibasilar atelectasis. Mild bilateral interstitial prominence with may reflect mild interstitial edema.   Electronically Signed   By: Elige Ko   On: 11/13/2013 08:42   Ct Angio Chest W/cm &/or Wo Cm  11/07/2013   CLINICAL DATA:  Shortness of Breath imaged intermittent chest pain. Symptoms since 11/15/2013.  EXAM: CT ANGIOGRAPHY CHEST WITH CONTRAST  TECHNIQUE: Multidetector CT imaging of the chest was performed using the standard protocol during bolus administration of intravenous contrast. Multiplanar CT image reconstructions and MIPs were obtained to evaluate the vascular anatomy.  CONTRAST:  80 mL OMNIPAQUE IOHEXOL 350 MG/ML SOLN  COMPARISON:  CT chest 11/16/2013.  FINDINGS: The study is limited by bolus timing. No pulmonary embolus is identified. The patient has a new small right pleural effusion. There is no left pleural effusion or pericardial effusion. Cardiomegaly is identified as on the prior study. Multiple small bilateral axillary lymph nodes are unchanged. No hilar or mediastinal lymphadenopathy is identified.  Right much worse than left basilar airspace disease has markedly worsened since the prior examination. Multiple air bronchograms are seen in the right base. Visualized upper abdomen demonstrates no focal abnormality. No focal bony abnormality is identified.  Review of the MIP images confirms the above findings.  IMPRESSION: Right worse than left basilar airspace disease has markedly worsened since the prior study and could be due to atelectasis, pneumonia or less likely aspiration. New small right pleural effusion is identified.  Negative for pulmonary embolus.  Cardiomegaly.   Electronically Signed   By: Drusilla Kanner M.D.   On: 11/06/2013 15:09   Medications: I have reviewed the patient's current medications. Scheduled Meds: . heparin  5,000  Units Subcutaneous 3 times per day  . ibuprofen  600 mg Oral TID  . loratadine  10 mg Oral Daily  . polyethylene glycol  17 g Oral Daily   Continuous Infusions:  PRN Meds:.HYDROcodone-acetaminophen, morphine injection, ondansetron (ZOFRAN) IV Assessment/Plan: Principal Problem:   Pleuritic chest pain Active Problems:   GERD (gastroesophageal reflux disease)   Benign essential HTN   Dyspnea  Ms. Courtney Cortez is a 39 year old obese female with GERD, HTN who presented with 2-day history of chest pain and shortness.of breath found to have rhabdomyolosis.   Rhabdomyolysis: Elevated CK and UA findings consistent with this diagnosis and can account for her distress. Possibly related to her ingestion of weight loss treatment though unclear of etiology. Per RN, she also endorsed multiple sexual partners and may be at risk for STIs. -Check HIV; if negative, check viral load given if she is the early window of disease -Repeat UA & CK -Increase IV hydration to 250cc/hr and repeat BMET to avoid iatrogenic hyponatremia -Continue pain control: alternate Norco 5/325 1-2 tabs q4hr & morphine 2mg  IV q4hr  Hypokalemia: K 3.6,  up from 3.4 yesterday. Likely related to rhabdo.   GERD: Continue home meds.   HTN: Holding home meds.   #FEN:  -Diet: Heart Healthy   #DVT prophylaxis: heparin 5000 units subcutaneous   #CODE STATUS: FULL CODE  Dispo: Disposition is deferred at this time, awaiting improvement of current medical problems.  Anticipated discharge in approximately 1-2 day(s).   The patient does have a current PCP [place] Bottom(Alvin Corliss MarcusVincent Blount, MD) and [facility] not [facility] after [facility].  The patient does not know have transportation limitations that hinder transportation to clinic appointments.  .Services Needed at time of discharge: Y = Yes, Blank = No PT:   OT:   RN:   Equipment:   Other:     LOS: 1 day   Heywood Ilesushil Patel, MD 11/18/2013, 3:09 PM

## 2013-11-18 NOTE — Progress Notes (Signed)
UR completed 

## 2013-11-18 NOTE — Progress Notes (Signed)
SUBJECTIVE:  She reports that she has less abdominal pain.  Breathing is slightly better   PHYSICAL EXAM Filed Vitals:   09/29/13 1527 09/29/13 2035 11/18/13 0235 11/18/13 0603  BP: 151/96 118/84 133/65 113/77  Pulse: 122 120 128 107  Temp: 99.4 F (37.4 C) 98 F (36.7 C) 99.4 F (37.4 C) 97.8 F (36.6 C)  TempSrc: Oral Oral Oral Oral  Resp: 22 20 22 20   Height: 5\' 8"  (1.727 m)     Weight: 230 lb 9.6 oz (104.6 kg)   234 lb 12.6 oz (106.5 kg)  SpO2: 96% 98% 96% 96%   General:  No acute distress, mild tachypnea Lungs:  Decreased breath sounds left greater than right Heart:  RRR Abdomen:  Positive bowel sounds, no rebound no guarding Extremities:  No edema   LABS: Lab Results  Component Value Date   TROPONINI <0.30 11/18/2013   Results for orders placed during the hospital encounter of 09/29/13 (from the past 24 hour(s))  CBC WITH DIFFERENTIAL     Status: Abnormal   Collection Time    09/29/13 10:22 AM      Result Value Ref Range   WBC 8.6  4.0 - 10.5 K/uL   RBC 3.47 (*) 3.87 - 5.11 MIL/uL   Hemoglobin 10.0 (*) 12.0 - 15.0 g/dL   HCT 81.130.2 (*) 91.436.0 - 78.246.0 %   MCV 87.0  78.0 - 100.0 fL   MCH 28.8  26.0 - 34.0 pg   MCHC 33.1  30.0 - 36.0 g/dL   RDW 95.614.5  21.311.5 - 08.615.5 %   Platelets 289  150 - 400 K/uL   Neutrophils Relative % 84 (*) 43 - 77 %   Lymphocytes Relative 11 (*) 12 - 46 %   Monocytes Relative 5  3 - 12 %   Eosinophils Relative 0  0 - 5 %   Basophils Relative 0  0 - 1 %   Neutro Abs 7.3  1.7 - 7.7 K/uL   Lymphs Abs 0.9  0.7 - 4.0 K/uL   Monocytes Absolute 0.4  0.1 - 1.0 K/uL   Eosinophils Absolute 0.0  0.0 - 0.7 K/uL   Basophils Absolute 0.0  0.0 - 0.1 K/uL   WBC Morphology INCREASED BANDS (>20% BANDS)    BASIC METABOLIC PANEL     Status: Abnormal   Collection Time    09/29/13 10:22 AM      Result Value Ref Range   Sodium 133 (*) 137 - 147 mEq/L   Potassium 3.4 (*) 3.7 - 5.3 mEq/L   Chloride 97  96 - 112 mEq/L   CO2 24  19 - 32 mEq/L   Glucose,  Bld 117 (*) 70 - 99 mg/dL   BUN 16  6 - 23 mg/dL   Creatinine, Ser 5.780.93  0.50 - 1.10 mg/dL   Calcium 9.0  8.4 - 46.910.5 mg/dL   GFR calc non Af Amer 78 (*) >90 mL/min   GFR calc Af Amer 90 (*) >90 mL/min   Anion gap 12  5 - 15  PRO B NATRIURETIC PEPTIDE     Status: Abnormal   Collection Time    09/29/13 10:22 AM      Result Value Ref Range   Pro B Natriuretic peptide (BNP) 577.2 (*) 0 - 125 pg/mL  TROPONIN I     Status: None   Collection Time    09/29/13 10:22 AM      Result Value Ref Range   Troponin  I <0.30  <0.30 ng/mL  POC URINE PREG, ED     Status: None   Collection Time    11/16/2013  1:29 PM      Result Value Ref Range   Preg Test, Ur NEGATIVE  NEGATIVE  TSH     Status: None   Collection Time    11/15/2013  1:33 PM      Result Value Ref Range   TSH 1.380  0.350 - 4.500 uIU/mL  T4, FREE     Status: None   Collection Time    11/19/2013  1:33 PM      Result Value Ref Range   Free T4 0.81  0.80 - 1.80 ng/dL  TROPONIN I     Status: None   Collection Time    10/31/2013  1:33 PM      Result Value Ref Range   Troponin I <0.30  <0.30 ng/mL  URINE RAPID DRUG SCREEN (HOSP PERFORMED)     Status: Abnormal   Collection Time    10/31/2013  1:35 PM      Result Value Ref Range   Opiates NONE DETECTED  NONE DETECTED   Cocaine NONE DETECTED  NONE DETECTED   Benzodiazepines NONE DETECTED  NONE DETECTED   Amphetamines NONE DETECTED  NONE DETECTED   Tetrahydrocannabinol NONE DETECTED  NONE DETECTED   Barbiturates POSITIVE (*) NONE DETECTED  HEPATIC FUNCTION PANEL     Status: Abnormal   Collection Time    11/18/2013  6:43 PM      Result Value Ref Range   Total Protein 11.0 (*) 6.0 - 8.3 g/dL   Albumin 2.9 (*) 3.5 - 5.2 g/dL   AST 16  0 - 37 U/L   ALT 10  0 - 35 U/L   Alkaline Phosphatase 53  39 - 117 U/L   Total Bilirubin 0.5  0.3 - 1.2 mg/dL   Bilirubin, Direct <1.6<0.2  0.0 - 0.3 mg/dL   Indirect Bilirubin NOT CALCULATED  0.3 - 0.9 mg/dL  LIPASE, BLOOD     Status: None   Collection Time     11/03/2013  6:43 PM      Result Value Ref Range   Lipase 12  11 - 59 U/L  TROPONIN I     Status: None   Collection Time    11/08/2013  7:36 PM      Result Value Ref Range   Troponin I <0.30  <0.30 ng/mL  TROPONIN I     Status: None   Collection Time    11/18/13  1:34 AM      Result Value Ref Range   Troponin I <0.30  <0.30 ng/mL  URINALYSIS, ROUTINE W REFLEX MICROSCOPIC     Status: Abnormal   Collection Time    11/18/13  6:40 AM      Result Value Ref Range   Color, Urine AMBER (*) YELLOW   APPearance CLOUDY (*) CLEAR   Specific Gravity, Urine 1.025  1.005 - 1.030   pH 5.0  5.0 - 8.0   Glucose, UA NEGATIVE  NEGATIVE mg/dL   Hgb urine dipstick MODERATE (*) NEGATIVE   Bilirubin Urine SMALL (*) NEGATIVE   Ketones, ur 15 (*) NEGATIVE mg/dL   Protein, ur >109>300 (*) NEGATIVE mg/dL   Urobilinogen, UA 1.0  0.0 - 1.0 mg/dL   Nitrite NEGATIVE  NEGATIVE   Leukocytes, UA NEGATIVE  NEGATIVE  URINE MICROSCOPIC-ADD ON     Status: Abnormal   Collection Time  11/18/13  6:40 AM      Result Value Ref Range   Squamous Epithelial / LPF FEW (*) RARE   WBC, UA 0-2  <3 WBC/hpf   RBC / HPF 0-2  <3 RBC/hpf   Bacteria, UA RARE  RARE   Casts HYALINE CASTS (*) NEGATIVE   Urine-Other MUCOUS PRESENT      Intake/Output Summary (Last 24 hours) at 11/18/13 0719 Last data filed at 11/18/13 1610  Gross per 24 hour  Intake   1600 ml  Output    300 ml  Net   1300 ml     ASSESSMENT AND PLAN:  DYSPNEA:  Abnormal CT per primary team.  No PE.   Mildly elevated proBNP is nonspecific.    PLEURITIC CHEST PAIN:   Echo with some evidence of pericardial effusion on echo.  We are treating as possible pericarditis.  Continue treatment with Motrin.   Fayrene Fearing [facility] 11/18/2013 7:19 AM

## 2013-11-19 ENCOUNTER — Inpatient Hospital Stay (HOSPITAL_COMMUNITY): Payer: PRIVATE HEALTH INSURANCE

## 2013-11-19 DIAGNOSIS — E875 Hyperkalemia: Secondary | ICD-10-CM | POA: Diagnosis not present

## 2013-11-19 DIAGNOSIS — F419 Anxiety disorder, unspecified: Secondary | ICD-10-CM

## 2013-11-19 DIAGNOSIS — Z7982 Long term (current) use of aspirin: Secondary | ICD-10-CM | POA: Diagnosis not present

## 2013-11-19 DIAGNOSIS — M1990 Unspecified osteoarthritis, unspecified site: Secondary | ICD-10-CM | POA: Diagnosis present

## 2013-11-19 DIAGNOSIS — I776 Arteritis, unspecified: Secondary | ICD-10-CM | POA: Diagnosis present

## 2013-11-19 DIAGNOSIS — E876 Hypokalemia: Secondary | ICD-10-CM | POA: Diagnosis present

## 2013-11-19 DIAGNOSIS — J13 Pneumonia due to Streptococcus pneumoniae: Secondary | ICD-10-CM | POA: Diagnosis present

## 2013-11-19 DIAGNOSIS — I319 Disease of pericardium, unspecified: Secondary | ICD-10-CM

## 2013-11-19 DIAGNOSIS — E860 Dehydration: Secondary | ICD-10-CM | POA: Diagnosis present

## 2013-11-19 DIAGNOSIS — R739 Hyperglycemia, unspecified: Secondary | ICD-10-CM | POA: Diagnosis not present

## 2013-11-19 DIAGNOSIS — K219 Gastro-esophageal reflux disease without esophagitis: Secondary | ICD-10-CM | POA: Diagnosis present

## 2013-11-19 DIAGNOSIS — I469 Cardiac arrest, cause unspecified: Secondary | ICD-10-CM | POA: Diagnosis not present

## 2013-11-19 DIAGNOSIS — M359 Systemic involvement of connective tissue, unspecified: Secondary | ICD-10-CM | POA: Diagnosis present

## 2013-11-19 DIAGNOSIS — R0781 Pleurodynia: Secondary | ICD-10-CM | POA: Diagnosis present

## 2013-11-19 DIAGNOSIS — D638 Anemia in other chronic diseases classified elsewhere: Secondary | ICD-10-CM | POA: Diagnosis present

## 2013-11-19 DIAGNOSIS — R944 Abnormal results of kidney function studies: Secondary | ICD-10-CM

## 2013-11-19 DIAGNOSIS — M6282 Rhabdomyolysis: Secondary | ICD-10-CM | POA: Diagnosis present

## 2013-11-19 DIAGNOSIS — J90 Pleural effusion, not elsewhere classified: Secondary | ICD-10-CM | POA: Diagnosis present

## 2013-11-19 DIAGNOSIS — I313 Pericardial effusion (noninflammatory): Secondary | ICD-10-CM | POA: Diagnosis present

## 2013-11-19 DIAGNOSIS — G9340 Encephalopathy, unspecified: Secondary | ICD-10-CM | POA: Diagnosis not present

## 2013-11-19 DIAGNOSIS — N170 Acute kidney failure with tubular necrosis: Secondary | ICD-10-CM | POA: Diagnosis not present

## 2013-11-19 DIAGNOSIS — Z79899 Other long term (current) drug therapy: Secondary | ICD-10-CM | POA: Diagnosis not present

## 2013-11-19 DIAGNOSIS — I10 Essential (primary) hypertension: Secondary | ICD-10-CM | POA: Diagnosis present

## 2013-11-19 DIAGNOSIS — E872 Acidosis: Secondary | ICD-10-CM | POA: Diagnosis not present

## 2013-11-19 DIAGNOSIS — B348 Other viral infections of unspecified site: Secondary | ICD-10-CM | POA: Diagnosis present

## 2013-11-19 DIAGNOSIS — R34 Anuria and oliguria: Secondary | ICD-10-CM | POA: Diagnosis present

## 2013-11-19 DIAGNOSIS — A4189 Other specified sepsis: Secondary | ICD-10-CM | POA: Diagnosis present

## 2013-11-19 DIAGNOSIS — R079 Chest pain, unspecified: Secondary | ICD-10-CM

## 2013-11-19 DIAGNOSIS — A403 Sepsis due to Streptococcus pneumoniae: Secondary | ICD-10-CM | POA: Diagnosis present

## 2013-11-19 DIAGNOSIS — Z6837 Body mass index (BMI) 37.0-37.9, adult: Secondary | ICD-10-CM | POA: Diagnosis not present

## 2013-11-19 DIAGNOSIS — R680 Hypothermia, not associated with low environmental temperature: Secondary | ICD-10-CM | POA: Diagnosis not present

## 2013-11-19 DIAGNOSIS — E669 Obesity, unspecified: Secondary | ICD-10-CM | POA: Diagnosis present

## 2013-11-19 DIAGNOSIS — D696 Thrombocytopenia, unspecified: Secondary | ICD-10-CM | POA: Diagnosis present

## 2013-11-19 DIAGNOSIS — J9601 Acute respiratory failure with hypoxia: Secondary | ICD-10-CM | POA: Diagnosis not present

## 2013-11-19 DIAGNOSIS — R6521 Severe sepsis with septic shock: Secondary | ICD-10-CM | POA: Diagnosis present

## 2013-11-19 LAB — BASIC METABOLIC PANEL
Anion gap: 13 (ref 5–15)
BUN: 27 mg/dL — ABNORMAL HIGH (ref 6–23)
CO2: 21 mEq/L (ref 19–32)
Calcium: 8.8 mg/dL (ref 8.4–10.5)
Chloride: 99 mEq/L (ref 96–112)
Creatinine, Ser: 1.04 mg/dL (ref 0.50–1.10)
GFR, EST AFRICAN AMERICAN: 79 mL/min — AB (ref >90)
GFR, EST NON AFRICAN AMERICAN: 68 mL/min — AB (ref >90)
Glucose, Bld: 143 mg/dL — ABNORMAL HIGH (ref 70–99)
Potassium: 3.8 mEq/L (ref 3.7–5.3)
SODIUM: 133 meq/L — AB (ref 137–147)

## 2013-11-19 LAB — TROPONIN I
Troponin I: <0.3 ng/mL (ref <0.30)
Troponin I: <0.3 ng/mL (ref <0.30)
Troponin I: <0.3 ng/mL (ref <0.30)

## 2013-11-19 LAB — HIV ANTIBODY (ROUTINE TESTING W REFLEX): HIV: NONREACTIVE

## 2013-11-19 LAB — GLUCOSE, CAPILLARY: GLUCOSE-CAPILLARY: 174 mg/dL — AB (ref 70–99)

## 2013-11-19 LAB — MAGNESIUM: Magnesium: 2 mg/dL (ref 1.5–2.5)

## 2013-11-19 LAB — CK: Total CK: 361 U/L — ABNORMAL HIGH (ref 7–177)

## 2013-11-19 MED ORDER — PANTOPRAZOLE SODIUM 40 MG PO TBEC
40.0000 mg | DELAYED_RELEASE_TABLET | Freq: Every day | ORAL | Status: DC
  Administered 2013-11-19: 40 mg via ORAL
  Filled 2013-11-19: qty 1

## 2013-11-19 MED ORDER — IBUPROFEN 600 MG PO TABS
600.0000 mg | ORAL_TABLET | Freq: Four times a day (QID) | ORAL | Status: DC
  Administered 2013-11-20: 600 mg via ORAL
  Filled 2013-11-19 (×5): qty 1

## 2013-11-19 MED ORDER — IBUPROFEN 600 MG PO TABS: 600.0000 mg | ORAL_TABLET | Freq: Four times a day (QID) | ORAL | Status: DC

## 2013-11-19 MED ORDER — COLCHICINE 0.6 MG PO TABS
0.6000 mg | ORAL_TABLET | Freq: Two times a day (BID) | ORAL | Status: DC
  Administered 2013-11-19: 0.6 mg via ORAL
  Filled 2013-11-19 (×2): qty 1

## 2013-11-19 MED ORDER — CLONAZEPAM 0.5 MG PO TABS: 1.0000 mg | ORAL_TABLET | Freq: Two times a day (BID) | ORAL | Status: DC

## 2013-11-19 MED ORDER — CLONAZEPAM 0.5 MG PO TABS
1.0000 mg | ORAL_TABLET | Freq: Two times a day (BID) | ORAL | Status: DC
  Administered 2013-11-19 – 2013-11-22 (×5): 1 mg via ORAL
  Filled 2013-11-19 (×5): qty 2

## 2013-11-19 MED ORDER — LORAZEPAM 1 MG PO TABS
1.0000 mg | ORAL_TABLET | Freq: Once | ORAL | Status: AC
  Administered 2013-11-19: 1 mg via ORAL
  Filled 2013-11-19: qty 1

## 2013-11-19 MED ORDER — COLCHICINE 0.6 MG PO TABS
0.6000 mg | ORAL_TABLET | Freq: Two times a day (BID) | ORAL | Status: DC
  Administered 2013-11-19: 0.6 mg via ORAL
  Filled 2013-11-19 (×3): qty 1

## 2013-11-19 MED ORDER — MORPHINE SULFATE 2 MG/ML IJ SOLN
1.0000 mg | Freq: Once | INTRAMUSCULAR | Status: AC | PRN
Start: 2013-11-19 — End: 2013-11-19
  Administered 2013-11-19: 1 mg via INTRAVENOUS
  Filled 2013-11-19: qty 1

## 2013-11-19 MED ORDER — KETOROLAC TROMETHAMINE 30 MG/ML IJ SOLN
30.0000 mg | Freq: Once | INTRAMUSCULAR | Status: AC
  Administered 2013-11-19: 30 mg via INTRAVENOUS
  Filled 2013-11-19: qty 1

## 2013-11-19 MED ORDER — CLONAZEPAM 0.5 MG PO TABS
0.5000 mg | ORAL_TABLET | Freq: Two times a day (BID) | ORAL | Status: DC
  Administered 2013-11-19: 0.5 mg via ORAL
  Filled 2013-11-19: qty 1

## 2013-11-19 NOTE — Progress Notes (Signed)
  Echocardiogram 2D Echocardiogram has been performed.  Arvil ChacoFoster, Maxwell Martorano 11/19/2013, 4:07 PM

## 2013-11-19 NOTE — Significant Event (Addendum)
Rapid Response Event Note  Overview:  Called to see patient for Nursing consult. Concerns over patient presentation, seeming ineffectivenss of pain and anxiety med, shallow rapid breathing and new onset diaphoresis.    Initial Focused Assessment: Upon arrival patient is in bed, awake, alert. Respirations are rapid and shallow, and she has difficulty taking a deep breath. SHe also has difficulty speaking, speaking in short answers with breathlessness. There is obvious perspiration on forehead and neck. SHe reports an 8 out of 10 chest pain. Nursing staff reports O2Sats are stable.  Lung sounds are clear to auscultation and there is air movement most lung fields; possibly diminished in left base. Breaths are very shallow and quite rapid. There does not seem to be air hunger. Heart rate is rapid and contractions are forceful to auscultation.  RN reports patient has received klonopin, morphine vicoden for pain and anxiety.    Reviewed labs, xrays, etc.   Interventions: Spoke to patient about positioning for maximum comfort. Replaced warmer linen with cooler sheets. SHe requested a fan--gave her nearby envelope to fan herself. Discussed healng period for this condition. Also encouraged her to do some sigh breaths as often as she could to try to expand bases of lungs. In light of CXR findings 10/22 it is concerning the shallowness of her breathing.  OF note--record indicates that Toradol gave some relief in the ED. RN to call MD to see if med adjustment to include Toradol is possible.   Event Summary:   Patient stable but uncomfortable at  1548. Echocardiogram being done in room at this time. Will follow and place on Rapid Response report for monitoring.     at          Kristine LineaLackey, Ajla Mcgeachy Ann

## 2013-11-19 NOTE — Progress Notes (Signed)
Patient ID: Courtney Cortez, female   DOB: 03/14/1975, 38 y.o.   MRN: [id number]     Subjective:    Still with chest pain, SOB  Objective:   Temp:  [97.9 F (36.6 C)-99.2 F (37.3 C)] 97.9 F (36.6 C) (10/24 0558) Pulse Rate:  [113-134] 122 (10/24 0558) Resp:  [20-22] 22 (10/24 0700) BP: (122-144)/(68-95) 128/84 mmHg (10/24 0558) SpO2:  [91 %-97 %] 97 % (10/24 0700) Weight:  [233 lb 4.7 oz (105.821 kg)] 233 lb 4.7 oz (105.821 kg) (10/24 0558) Last BM Date: 11/14/13  Filed Weights   11/16/2013 1527 11/18/13 0603 11/19/13 0558  Weight: 230 lb 9.6 oz (104.6 kg) 234 lb 12.6 oz (106.5 kg) 233 lb 4.7 oz (105.821 kg)    Intake/Output Summary (Last 24 hours) at 11/19/13 1102 Last data filed at 11/18/13 1802  Gross per 24 hour  Intake    600 ml  Output    850 ml  Net   -250 ml    Telemetry: sinus tach 120s  Exam:  General: NAD  Resp: CTAB  Cardiac: regular, rate 120, no m/r/g, no JVD  GI: abdomen soft, NT, ND  MSK: no LE edema  Neuro: no focal deficits   Lab Results:  Basic Metabolic Panel:  Recent Labs Lab 11/18/13 0650 11/18/13 1845 11/19/13 0230  NA 132* 133* 133*  K 3.6* 3.7 3.8  CL 94* 96 99  CO2 22 23 21   GLUCOSE 134* 140* 143*  BUN 25* 28* 27*  CREATININE 1.23* 1.25* 1.04  CALCIUM 8.7 9.1 8.8  MG  --   --  2.0    Liver Function Tests:  Recent Labs Lab 10/29/2013 1843  AST 16  ALT 10  ALKPHOS 53  BILITOT 0.5  PROT 11.0*  ALBUMIN 2.9*    CBC:  Recent Labs Lab 11/15/13 2351 11/19/2013 1022 11/18/13 0650  WBC 6.6 8.6 6.2  HGB 9.4* 10.0* 10.0*  HCT 28.1* 30.2* 29.8*  MCV 87.3 87.0 86.4  PLT 318 289 288    Cardiac Enzymes:  Recent Labs Lab 11/25/2013 1936 11/18/13 0134 11/18/13 0650 11/18/13 1845 11/19/13 0230  CKTOTAL  --   --  492* 482* 361*  TROPONINI <0.30 <0.30  --   --  <0.30    BNP:  Recent Labs  11/15/13 2351 11/05/2013 1022  PROBNP 63.1 577.2*    Coagulation: No results found for this basename: INR,  in the last  168 hours  ECG:   Medications:   Scheduled Medications: . clonazePAM  0.5 mg Oral BID  . colchicine  0.6 mg Oral BID  . heparin  5,000 Units Subcutaneous 3 times per day  . ibuprofen  600 mg Oral TID  . loratadine  10 mg Oral Daily  . pantoprazole  40 mg Oral Daily  . polyethylene glycol  17 g Oral Daily     Infusions:     PRN Medications:  HYDROcodone-acetaminophen, morphine injection, ondansetron (ZOFRAN) IV     Assessment/Plan    38 yo female hx of GERD and HTN admitted with chest pain and SOB  1.Pleuritic chest pain - constant chest pain, worst with deep breathing and movements. Improved with toradol in ER - CT PE negative but did show R>L basilar airspace disease (atelectasis vs pneumonia). Echo LVEF 65-70%, now WMAs, trivial pericardial effusion. EKG some suggestion of pericarditis with PR depression PR elevation in aVR, and mild ST elevations. Trop negative. - being treated for possible pericarditis with colchicine and ibuprofen, symptoms overall remain stable  2. Sinus tachycardia - heart rates trending up since 2013-08-28, stable in 110s-120s since yesterday. Likely related to pain, potentially some hypovolemia. With her suspected pericarditis, small effusion on 10/22 will repeat limited echo to make sure she does not have rapidly increasing size of her effusion. Stable and normal blood pressures.       Courtney RichJonathan Iracema Cortez, M.D.

## 2013-11-19 NOTE — Progress Notes (Signed)
Subjective: Patient continues to c/o chest pain and inability to take deep breaths.  Objective: Vital signs in last 24 hours: Filed Vitals:   11/18/13 1431 11/18/13 2104 11/19/13 0237 11/19/13 0558  BP: 136/95 122/68 144/78 128/84  Pulse: 134 113 115 122  Temp: 99.2 F (37.3 C) 98.1 F (36.7 C) 98.3 F (36.8 C) 97.9 F (36.6 C)  TempSrc: Oral Oral Oral Oral  Resp: 22 22 22 22   Height:      Weight:    233 lb 4.7 oz (105.821 kg)  SpO2: 93% 96% 91% 97%   Weight change: -4 lb 11.3 oz (-2.135 kg)  Intake/Output Summary (Last 24 hours) at 11/19/13 0720 Last data filed at 11/18/13 1802  Gross per 24 hour  Intake    720 ml  Output    850 ml  Net   -130 ml   General: resting in bed , on 2.5L supplemental O2 via [place] HEENT:EOMI, no scleral icterus Cardiac: RRR, no rub or murmur appreciated Pulm: clear to auscultation bilaterally, shallow breathing Abd: soft, nontender, nondistended, BS present Ext: warm and well perfused, no pedal edema Psych: appears very anxious   Lab Results: Basic Metabolic Panel:  Recent Labs Lab 11/18/13 0650 11/18/13 1845 11/19/13 0230  NA 132* 133* 133*  K 3.6* 3.7 3.8  CL 94* 96 99  CO2 22 23 21   GLUCOSE 134* 140* 143*  BUN 25* 28* 27*  CREATININE 1.23* 1.25* 1.04  CALCIUM 8.7 9.1 8.8  MG  --   --  2.0  PHOS  --  3.2  --    CBC:  Recent Labs Lab 11/06/2013 1022 11/18/13 0650  WBC 8.6 6.2  NEUTROABS 7.3  --   HGB 10.0* 10.0*  HCT 30.2* 29.8*  MCV 87.0 86.4  PLT 289 288   Cardiac Enzymes:  Recent Labs Lab 11/03/2013 1936 11/18/13 0134 11/18/13 0650 11/18/13 1845 11/19/13 0230  CKTOTAL  --   --  492* 482* 361*  TROPONINI <0.30 <0.30  --   --  <0.30   Urinalysis:  Recent Labs Lab 11/18/13 0640 11/18/13 1800  COLORURINE AMBER* AMBER*  LABSPEC 1.025 1.026  PHURINE 5.0 5.5  GLUCOSEU NEGATIVE NEGATIVE  HGBUR MODERATE* LARGE*  BILIRUBINUR SMALL* NEGATIVE  KETONESUR 15* NEGATIVE  PROTEINUR >300* 100*  UROBILINOGEN 1.0  1.0  NITRITE NEGATIVE NEGATIVE  LEUKOCYTESUR NEGATIVE NEGATIVE   Studies/Results: Ct Abdomen Pelvis Wo Contrast  11/05/2013   CLINICAL DATA:  38 year old female with acute shortness of breath and abdominal pain. Initial encounter.  EXAM: CT ABDOMEN AND PELVIS WITHOUT CONTRAST  TECHNIQUE: Multidetector CT imaging of the abdomen and pelvis was performed following the standard protocol without IV contrast.  COMPARISON:  Abdomen ultrasound 12/29/2010 and earlier. Chest CTA 1431 hr today.  FINDINGS: Stable lung bases from earlier today, see that report.  No acute osseous abnormality identified.  Excreted IV contrast in the urinary bladder. No pelvic free fluid identified. Negative non contrast uterus and adnexa. Negative distal colon except for redundancy.  Left colon, transverse colon, right colon and appendix are normal. No dilated small bowel. Oral contrast in the stomach and proximal small bowel loops. Negative duodenum.  Vicarious contrast excretion to the gallbladder. Negative non contrast liver, spleen, pancreas, adrenal glands, and kidneys (symmetric renal contrast excretion). No abdominal free fluid. Mild subcutaneous stranding along both flanks. No lymphadenopathy identified in the abdomen or pelvis in the absence of IV contrast.  IMPRESSION: 1. No acute or inflammatory finding identified in the abdomen or  pelvis. 2. Stable abnormal lung bases from chest CTA earlier today (please see that report).   Electronically Signed   By: Augusto Gamble M.D.   On: 11/21/2013 22:50   Dg Chest 2 View  11/12/2013   CLINICAL DATA:  Chest pain, shortness of breath  EXAM: CHEST  2 VIEW  COMPARISON:  CT chest 11/16/2013  FINDINGS: There is bibasilar atelectasis. There is bilateral mild interstitial thickening. There is no focal parenchymal opacity, pleural effusion, or pneumothorax. The heart and mediastinal contours are unremarkable.  The osseous structures are unremarkable.  IMPRESSION: Mild bibasilar atelectasis. Mild  bilateral interstitial prominence with may reflect mild interstitial edema.   Electronically Signed   By: Elige Ko   On: 11/14/2013 08:42   Ct Angio Chest W/cm &/or Wo Cm  11/15/2013   CLINICAL DATA:  Shortness of Breath imaged intermittent chest pain. Symptoms since 11/15/2013.  EXAM: CT ANGIOGRAPHY CHEST WITH CONTRAST  TECHNIQUE: Multidetector CT imaging of the chest was performed using the standard protocol during bolus administration of intravenous contrast. Multiplanar CT image reconstructions and MIPs were obtained to evaluate the vascular anatomy.  CONTRAST:  80 mL OMNIPAQUE IOHEXOL 350 MG/ML SOLN  COMPARISON:  CT chest 11/16/2013.  FINDINGS: The study is limited by bolus timing. No pulmonary embolus is identified. The patient has a new small right pleural effusion. There is no left pleural effusion or pericardial effusion. Cardiomegaly is identified as on the prior study. Multiple small bilateral axillary lymph nodes are unchanged. No hilar or mediastinal lymphadenopathy is identified.  Right much worse than left basilar airspace disease has markedly worsened since the prior examination. Multiple air bronchograms are seen in the right base. Visualized upper abdomen demonstrates no focal abnormality. No focal bony abnormality is identified.  Review of the MIP images confirms the above findings.  IMPRESSION: Right worse than left basilar airspace disease has markedly worsened since the prior study and could be due to atelectasis, pneumonia or less likely aspiration. New small right pleural effusion is identified.  Negative for pulmonary embolus.  Cardiomegaly.   Electronically Signed   By: Drusilla Kanner M.D.   On: 11/12/2013 15:09   Medications: I have reviewed the patient's current medications. Scheduled Meds: . heparin  5,000 Units Subcutaneous 3 times per day  . ibuprofen  600 mg Oral TID  . loratadine  10 mg Oral Daily  . polyethylene glycol  17 g Oral Daily   Continuous Infusions: .  sodium chloride 175 mL/hr at 11/19/13 0702   PRN Meds:.HYDROcodone-acetaminophen, morphine injection, ondansetron (ZOFRAN) IV Assessment/Plan: Principal Problem:   Rhabdomyolysis Active Problems:   Pleuritic chest pain   GERD (gastroesophageal reflux disease)   Benign essential HTN   Dyspnea  Ms. Courtney Cortez is a 38 year old obese female with GERD, HTN who presented with 2-day history of chest pain and shortness.of breath found to have elevated CK.   Pericarditis: EKG this AM with mild ST elevations, troponin's still negative.  Given her pleuritic chest pain, EKG changes and mild pericardial effusion on previous echo, and mild CK elevation this is most consistent with pericarditis. - Ibuprofen 600mg  TID -Add colchince 0.6 BID   Hypokalemia: resolved  GERD: add PPI for GI protection  HTN: currently normotensive  Anxiety: - Start Klonopin 0.5mg  BID  #FEN:  -Diet: Heart Healthy     #DVT prophylaxis: heparin 5000 units subcutaneous   #CODE STATUS: FULL CODE  Dispo: Disposition is deferred at this time, awaiting improvement of current medical problems.  Anticipated discharge  in approximately 1 day(s).   The patient does have a current PCP Britt Bottom(Alvin Corliss MarcusVincent Blount, MD) and [facility] after discharge.  The patient does not know have transportation limitations that hinder transportation to clinic appointments.  .Services Needed at time of discharge: Y = Yes, Blank = No PT:   OT:   RN:   Equipment:   Other:     LOS: 2 days   Gust RungErik C Chinara Hertzberg, DO 11/19/2013, 7:20 AM

## 2013-11-19 NOTE — Progress Notes (Signed)
Patient's HR 150. Complaints of chest pain when breathing deeply. Diaphoretic. MD aware. Pain medication given. Chest X-ray ordered.

## 2013-11-19 NOTE — Progress Notes (Signed)
Pt feeling better after pain medication 4/10 pain. HR 130-140s md made aware no new orders. Will monitor

## 2013-11-19 NOTE — Progress Notes (Signed)
Paged by RN that pt feeling like not getting enough oxygen. Evalauted pt at bedside. Upon first approaching patient she appears to be having mild shallow breathing. As interview progressed, her shallow breathing appeared to worsen. She reports she is having trouble taking deep breaths. She still has chest pain with breathing that is worse with movement. It is mid chest and below her breasts. She reports some relief with morphine but attributes that to being able to fall asleep.  Filed Vitals:   11/18/13 1113 11/18/13 1431 11/18/13 2104 11/19/13 0237  BP: 122/77 136/95 122/68 144/78  Pulse: 117 134 113 115  Temp:  99.2 F (37.3 C) 98.1 F (36.7 C) 98.3 F (36.8 C)  TempSrc:  Oral Oral Oral  Resp: 20 22 22 22   Height:      Weight:      SpO2: 96% 93% 96% 91%   General: mild distress Chest: CTAB Heart: tachycardic, regular rhythm Psych: mood and affect appropriate, somewhat anxious appearing  EKG: Sinus tachycardia, increased J point elevation and more prominent T wave in V1-3. Otherwise unchanged from previous EKG  A/P: Pleuritic chest pain -Check troponins -Check magnesium level -Morphine 1mg  x1

## 2013-11-19 NOTE — Progress Notes (Signed)
Patient has been having ongoing shallow breathing from the beginning of shift and was also notified of this issue in shift report but to what degree was not known. Earlier in shift patient received morphine for chest pain and when given seem to also help with breathing. Also at that time patient's lung sounded clear for was worried the bolus of fluids given for the rhabdomyolosis, were too much. Patient stated she still felt like she was not able to get enough and therefore turned oxygen from 2liters to 2.5 and patient stated it helped a little bit. Oxygen sats went from 95% to 98%. About an hour later patient stated she was getting too much oxygen and turned it back to 2 liters though patient still tachypnic. Morphine still to help both with pain and breathing.   Later tonight shortly after 2am, patient stated she could not breath but no chest pain. VSS, EKG done, and oxygen saturations were at 91%. Patient was very tachypnic with very shallow breathing. Notified doctor on-call. Doctors put in orders for STAT troponins and came to patient. Notified doctor that patient may be having some anxiety-like issues. Once doctors had come, patient begin to have some chest pain in mid and around rib cage. Orders given for one time dose of 1mg  Morphine and was given as ordered. Respiratory did come to assess patient, no breathing treatment was suggested unless the doctor wanted her to have it. Patient is currently lying in bed and breathing and respirations have improved. Respirations are 18 but are still somewhat shallow. Patient states it feels a little better with less struggle to breath. Will continue to monitor patient to end of shift.

## 2013-11-19 NOTE — Progress Notes (Signed)
Called RRT to look at pt. Pt. uncomfortable and having chest pain, breathing rapid and shallow lung sounds clear and diminished.pt becoming diaphoretic rating chest pain 8/10. Spoke with MD about changing pt. Pain medications and giving something for anxiety. Will monitor.

## 2013-11-19 NOTE — Plan of Care (Signed)
Problem: Phase I Progression Outcomes Goal: Dyspnea controlled at rest Outcome: Progressing Patient continues to have dyspnea at rest  Problem: Phase II Progression Outcomes Goal: O2 sats > equal to 90% on RA or at baseline Outcome: Progressing Patient continues to be on 2liters of oxygen or will desat

## 2013-11-19 NOTE — Progress Notes (Signed)
  Date: 11/19/2013  Patient name: Courtney Cortez  Medical record number: [id number]  Date of birth: 06/01/1975   This patient has been seen and the plan of care was discussed with the house staff. Please see their note for complete details. I concur with their findings with the following additions/corrections:  Repeat EKG noted this morning, now with some more obvious PR depression in some leads, PR elevation in AVR and ST elevation in AVL, this is likely pericarditis and an elevated CK can be related to that diagnosis.  We will add colchicine to her regimen today and continue to trend her CK.  Her renal function is improved with fluids, and mild bump may represent contrast load from CTA of the chest.  On exam this morning, she is having pain with taking a deep breath due to chest pain, but her oxygenation has been stable.  Will also treat her for anxiety today.  I attempted to appreciate a rub on auscultation of the heart, but I did not hear one.   Inez CatalinaEmily B Andrell Tallman, MD 11/19/2013, 10:08 AM

## 2013-11-19 NOTE — Progress Notes (Signed)
UR completed 

## 2013-11-20 ENCOUNTER — Inpatient Hospital Stay (HOSPITAL_COMMUNITY): Payer: PRIVATE HEALTH INSURANCE

## 2013-11-20 DIAGNOSIS — R0902 Hypoxemia: Secondary | ICD-10-CM

## 2013-11-20 DIAGNOSIS — J9601 Acute respiratory failure with hypoxia: Secondary | ICD-10-CM

## 2013-11-20 DIAGNOSIS — R000 Tachycardia, unspecified: Secondary | ICD-10-CM

## 2013-11-20 DIAGNOSIS — N179 Acute kidney failure, unspecified: Secondary | ICD-10-CM | POA: Diagnosis not present

## 2013-11-20 DIAGNOSIS — J90 Pleural effusion, not elsewhere classified: Secondary | ICD-10-CM

## 2013-11-20 LAB — CBC WITH DIFFERENTIAL/PLATELET
BASOS ABS: 0.1 10*3/uL (ref 0.0–0.1)
Basophils Relative: 1 % (ref 0–1)
EOS ABS: 0 10*3/uL (ref 0.0–0.7)
Eosinophils Relative: 0 % (ref 0–5)
HEMATOCRIT: 29.5 % — AB (ref 36.0–46.0)
Hemoglobin: 10 g/dL — ABNORMAL LOW (ref 12.0–15.0)
LYMPHS ABS: 0.6 10*3/uL — AB (ref 0.7–4.0)
Lymphocytes Relative: 9 % — ABNORMAL LOW (ref 12–46)
MCH: 28.8 pg (ref 26.0–34.0)
MCHC: 33.9 g/dL (ref 30.0–36.0)
MCV: 85 fL (ref 78.0–100.0)
Monocytes Absolute: 0.1 10*3/uL (ref 0.1–1.0)
Monocytes Relative: 1 % — ABNORMAL LOW (ref 3–12)
NEUTROS ABS: 5.5 10*3/uL (ref 1.7–7.7)
Neutrophils Relative %: 89 % — ABNORMAL HIGH (ref 43–77)
Platelets: 307 10*3/uL (ref 150–400)
RBC: 3.47 MIL/uL — ABNORMAL LOW (ref 3.87–5.11)
RDW: 14.9 % (ref 11.5–15.5)
WBC Morphology: INCREASED
WBC: 6.3 10*3/uL (ref 4.0–10.5)

## 2013-11-20 LAB — HIV ANTIBODY (ROUTINE TESTING W REFLEX): HIV 1&2 Ab, 4th Generation: NONREACTIVE

## 2013-11-20 LAB — BASIC METABOLIC PANEL
Anion gap: 24 — ABNORMAL HIGH (ref 5–15)
BUN: 28 mg/dL — ABNORMAL HIGH (ref 6–23)
CALCIUM: 9.1 mg/dL (ref 8.4–10.5)
CO2: 13 mEq/L — ABNORMAL LOW (ref 19–32)
Chloride: 95 mEq/L — ABNORMAL LOW (ref 96–112)
Creatinine, Ser: 1.73 mg/dL — ABNORMAL HIGH (ref 0.50–1.10)
GFR calc Af Amer: 42 mL/min — ABNORMAL LOW (ref >90)
GFR, EST NON AFRICAN AMERICAN: 37 mL/min — AB (ref >90)
Glucose, Bld: 194 mg/dL — ABNORMAL HIGH (ref 70–99)
POTASSIUM: 3.5 meq/L — AB (ref 3.7–5.3)
SODIUM: 132 meq/L — AB (ref 137–147)

## 2013-11-20 LAB — GLUCOSE, CAPILLARY
GLUCOSE-CAPILLARY: 139 mg/dL — AB (ref 70–99)
Glucose-Capillary: 170 mg/dL — ABNORMAL HIGH (ref 70–99)
Glucose-Capillary: 156 mg/dL — ABNORMAL HIGH (ref 70–99)

## 2013-11-20 LAB — BLOOD GAS, ARTERIAL
ACID-BASE DEFICIT: 8.8 mmol/L — AB (ref 0.0–2.0)
Acid-base deficit: 8.1 mmol/L — ABNORMAL HIGH (ref 0.0–2.0)
BICARBONATE: 16.8 meq/L — AB (ref 20.0–24.0)
Bicarbonate: 16.2 mEq/L — ABNORMAL LOW (ref 20.0–24.0)
DELIVERY SYSTEMS: POSITIVE
DRAWN BY: 249101
Drawn by: 369891
FIO2: 0.21 %
FIO2: 0.5 %
MODE: POSITIVE
O2 Saturation: 86.7 %
O2 Saturation: 96.7 %
PATIENT TEMPERATURE: 98.6
PATIENT TEMPERATURE: 98.6
PEEP/CPAP: 5 cmH2O
PH ART: 7.317 — AB (ref 7.350–7.450)
PRESSURE CONTROL: 12 cmH2O
RATE: 15 resp/min
TCO2: 17.2 mmol/L (ref 0–100)
TCO2: 17.9 mmol/L (ref 0–100)
pCO2 arterial: 32.8 mmHg — ABNORMAL LOW (ref 35.0–45.0)
pCO2 arterial: 33.9 mmHg — ABNORMAL LOW (ref 35.0–45.0)
pH, Arterial: 7.315 — ABNORMAL LOW (ref 7.350–7.450)
pO2, Arterial: 59.4 mmHg — ABNORMAL LOW (ref 80.0–100.0)
pO2, Arterial: 102 mmHg — ABNORMAL HIGH (ref 80.0–100.0)

## 2013-11-20 LAB — POCT I-STAT 3, ART BLOOD GAS (G3+)
Acid-base deficit: 8 mmol/L — ABNORMAL HIGH (ref 0.0–2.0)
BICARBONATE: 18.8 meq/L — AB (ref 20.0–24.0)
O2 Saturation: 100 %
PH ART: 7.257 — AB (ref 7.350–7.450)
PO2 ART: 193 mmHg — AB (ref 80.0–100.0)
TCO2: 20 mmol/L (ref 0–100)
pCO2 arterial: 42.2 mmHg (ref 35.0–45.0)

## 2013-11-20 LAB — LACTIC ACID, PLASMA
Lactic Acid, Venous: 6 mmol/L — ABNORMAL HIGH (ref 0.5–2.2)
Lactic Acid, Venous: 5.3 mmol/L — ABNORMAL HIGH (ref 0.5–2.2)
Lactic Acid, Venous: 4.4 mmol/L — ABNORMAL HIGH (ref 0.5–2.2)
Lactic Acid, Venous: 2.8 mmol/L — ABNORMAL HIGH (ref 0.5–2.2)

## 2013-11-20 LAB — IRON AND TIBC: UIBC: 152 ug/dL (ref 125–400)

## 2013-11-20 LAB — CK: Total CK: 200 U/L — ABNORMAL HIGH (ref 7–177)

## 2013-11-20 LAB — SODIUM, URINE, RANDOM: Sodium, Ur: 28 mEq/L

## 2013-11-20 LAB — HEMOGLOBIN A1C
Hgb A1c MFr Bld: 5.9 % — ABNORMAL HIGH (ref <5.7)
Mean Plasma Glucose: 123 mg/dL — ABNORMAL HIGH (ref <117)

## 2013-11-20 LAB — HEPATITIS C ANTIBODY: HCV Ab: NEGATIVE

## 2013-11-20 LAB — SEDIMENTATION RATE
Sed Rate: 130 mm/hr — ABNORMAL HIGH (ref 0–22)
Sed Rate: 130 mm/hr — ABNORMAL HIGH (ref 0–22)

## 2013-11-20 LAB — PRO B NATRIURETIC PEPTIDE: PRO B NATRI PEPTIDE: 6391 pg/mL — AB (ref 0–125)

## 2013-11-20 LAB — CREATININE, URINE, RANDOM: CREATININE, URINE: 316.85 mg/dL

## 2013-11-20 LAB — FERRITIN: Ferritin: 2168 ng/mL — ABNORMAL HIGH (ref 10–291)

## 2013-11-20 LAB — MRSA PCR SCREENING: MRSA by PCR: NEGATIVE

## 2013-11-20 LAB — TROPONIN I: Troponin I: <0.3 ng/mL (ref <0.30)

## 2013-11-20 LAB — RHEUMATOID FACTOR: Rhuematoid fact SerPl-aCnc: 170 IU/mL — ABNORMAL HIGH (ref <=14)

## 2013-11-20 MED ORDER — FUROSEMIDE 10 MG/ML IJ SOLN
40.0000 mg | Freq: Once | INTRAMUSCULAR | Status: AC
  Administered 2013-11-20: 40 mg via INTRAVENOUS

## 2013-11-20 MED ORDER — POTASSIUM CHLORIDE CRYS ER 20 MEQ PO TBCR: 40.0000 meq | EXTENDED_RELEASE_TABLET | Freq: Once | ORAL | Status: DC

## 2013-11-20 MED ORDER — METHYLPREDNISOLONE SODIUM SUCC 125 MG IJ SOLR
125.0000 mg | Freq: Four times a day (QID) | INTRAMUSCULAR | Status: DC
  Administered 2013-11-20 – 2013-11-21 (×3): 125 mg via INTRAVENOUS
  Filled 2013-11-20 (×9): qty 2

## 2013-11-20 MED ORDER — FENTANYL BOLUS VIA INFUSION
50.0000 ug | INTRAVENOUS | Status: DC | PRN
  Filled 2013-11-20: qty 100

## 2013-11-20 MED ORDER — PROPOFOL 10 MG/ML IV EMUL
5.0000 ug/kg/min | INTRAVENOUS | Status: DC
  Administered 2013-11-20: 25 ug/kg/min via INTRAVENOUS
  Administered 2013-11-21: 20 ug/kg/min via INTRAVENOUS
  Administered 2013-11-21: 20.031 ug/kg/min via INTRAVENOUS
  Filled 2013-11-20 (×2): qty 100

## 2013-11-20 MED ORDER — CHLORHEXIDINE GLUCONATE 0.12 % MT SOLN: 15.0000 mL | Freq: Two times a day (BID) | OROMUCOSAL | Status: DC

## 2013-11-20 MED ORDER — PROPOFOL 10 MG/ML IV EMUL
INTRAVENOUS | Status: AC
  Filled 2013-11-20: qty 100

## 2013-11-20 MED ORDER — CETYLPYRIDINIUM CHLORIDE 0.05 % MT LIQD
7.0000 mL | Freq: Two times a day (BID) | OROMUCOSAL | Status: DC
  Administered 2013-11-21 (×2): 7 mL via OROMUCOSAL

## 2013-11-20 MED ORDER — MIDAZOLAM HCL 2 MG/2ML IJ SOLN
INTRAMUSCULAR | Status: AC
  Administered 2013-11-20: 2 mg
  Filled 2013-11-20: qty 4

## 2013-11-20 MED ORDER — FENTANYL CITRATE 0.05 MG/ML IJ SOLN
50.0000 ug | Freq: Once | INTRAMUSCULAR | Status: AC
  Administered 2013-11-20: 50 ug via INTRAVENOUS

## 2013-11-20 MED ORDER — DEXTROSE 5 % IV SOLN
0.0000 ug/min | INTRAVENOUS | Status: DC
  Administered 2013-11-20: 10 ug/min via INTRAVENOUS
  Administered 2013-11-21: 40 ug/min via INTRAVENOUS
  Administered 2013-11-22 (×2): 20 ug/min via INTRAVENOUS
  Administered 2013-11-24: 50 ug/min via INTRAVENOUS
  Administered 2013-11-24: 100 ug/min via INTRAVENOUS
  Filled 2013-11-20 (×7): qty 2

## 2013-11-20 MED ORDER — SODIUM CHLORIDE 0.9 % IV BOLUS (SEPSIS)
500.0000 mL | Freq: Once | INTRAVENOUS | Status: AC
  Administered 2013-11-20: 250 mL via INTRAVENOUS

## 2013-11-20 MED ORDER — PANTOPRAZOLE SODIUM 40 MG IV SOLR
40.0000 mg | Freq: Every day | INTRAVENOUS | Status: DC
  Administered 2013-11-20 – 2013-11-25 (×6): 40 mg via INTRAVENOUS
  Filled 2013-11-20 (×6): qty 40

## 2013-11-20 MED ORDER — DEXTROSE 5 % IV SOLN
250.0000 mg | INTRAVENOUS | Status: DC
  Administered 2013-11-21 – 2013-11-23 (×3): 250 mg via INTRAVENOUS
  Filled 2013-11-20 (×3): qty 250

## 2013-11-20 MED ORDER — DEXTROSE 5 % IV SOLN
1.0000 g | INTRAVENOUS | Status: DC
  Administered 2013-11-20 – 2013-11-23 (×4): 1 g via INTRAVENOUS
  Filled 2013-11-20 (×6): qty 10

## 2013-11-20 MED ORDER — CETYLPYRIDINIUM CHLORIDE 0.05 % MT LIQD
7.0000 mL | Freq: Four times a day (QID) | OROMUCOSAL | Status: DC
  Administered 2013-11-20 – 2013-11-26 (×23): 7 mL via OROMUCOSAL

## 2013-11-20 MED ORDER — CHLORHEXIDINE GLUCONATE 0.12 % MT SOLN
15.0000 mL | Freq: Two times a day (BID) | OROMUCOSAL | Status: DC
  Administered 2013-11-20 – 2013-11-26 (×12): 15 mL via OROMUCOSAL
  Filled 2013-11-20 (×12): qty 15

## 2013-11-20 MED ORDER — FENTANYL CITRATE 0.05 MG/ML IJ SOLN
INTRAMUSCULAR | Status: AC
  Administered 2013-11-20: 50 ug via INTRAVENOUS
  Filled 2013-11-20: qty 4

## 2013-11-20 MED ORDER — DEXTROSE 5 % IV SOLN
500.0000 mg | Freq: Once | INTRAVENOUS | Status: AC
  Administered 2013-11-20: 500 mg via INTRAVENOUS
  Filled 2013-11-20: qty 500

## 2013-11-20 MED ORDER — FUROSEMIDE 10 MG/ML IJ SOLN
INTRAMUSCULAR | Status: AC
  Filled 2013-11-20: qty 4

## 2013-11-20 MED ORDER — INSULIN ASPART 100 UNIT/ML ~~LOC~~ SOLN
1.0000 [IU] | SUBCUTANEOUS | Status: DC
  Administered 2013-11-20: 2 [IU] via SUBCUTANEOUS
  Administered 2013-11-20 – 2013-11-21 (×6): 1 [IU] via SUBCUTANEOUS
  Administered 2013-11-22: 2 [IU] via SUBCUTANEOUS
  Administered 2013-11-22: 1 [IU] via SUBCUTANEOUS

## 2013-11-20 MED ORDER — SODIUM CHLORIDE 0.9 % IV SOLN
0.0000 ug/h | INTRAVENOUS | Status: DC
  Administered 2013-11-20: 50 ug/h via INTRAVENOUS
  Administered 2013-11-21: 150 ug/h via INTRAVENOUS
  Administered 2013-11-23: 50 ug/h via INTRAVENOUS
  Administered 2013-11-23: 100 ug/h via INTRAVENOUS
  Administered 2013-11-23: 50 ug/h via INTRAVENOUS
  Administered 2013-11-24: 100 ug/h via INTRAVENOUS
  Filled 2013-11-20 (×4): qty 50

## 2013-11-20 MED ORDER — METOPROLOL TARTRATE 1 MG/ML IV SOLN
5.0000 mg | Freq: Once | INTRAVENOUS | Status: AC
  Administered 2013-11-20: 5 mg via INTRAVENOUS
  Filled 2013-11-20: qty 5

## 2013-11-20 NOTE — Progress Notes (Signed)
Subjective: Overnight team called to patient's bedside for increased WOB and diaphoresis with increased tachycardia.  Obtained STAT CXR which showed worsening bilateral pleural effusions.  Gave IV lasix, transferred to step down and consulted PCCM.   Her AM labs show a lactic acidosis, acute renal failure, and hypoxic respiratory failure. Seen this morning with Dr. Criselda PeachesMullen, patient reports her chest pain is overall improved but not great, she feels that her breathing is similar to what it has been.  She has no new complaints. Objective: Vital signs in last 24 hours: Filed Vitals:   11/19/13 1804 11/19/13 2030 11/20/13 0608 11/20/13 0700  BP: 145/83 154/105 129/77 139/90  Pulse: 133 145 105 140  Temp:  97.2 F (36.2 C) 97.8 F (36.6 C) 98.3 F (36.8 C)  TempSrc:  Oral Oral Oral  Resp: 22 22 22  36  Height:      Weight:   238 lb 1.6 oz (108 kg) 240 lb 4.8 oz (109 kg)  SpO2:  100% 91% 95%   Weight change: 4 lb 12.9 oz (2.179 kg)  Intake/Output Summary (Last 24 hours) at 11/20/13 0833 Last data filed at 11/20/13 [id number]  Gross per 24 hour  Intake      0 ml  Output    900 ml  Net   -900 ml   General: on NRB with accessory muscle use in moderate disturess HEENT: EOMI, no scleral icterus Cardiac: tachycardiac Pulm: shallow breathing, no appreciated rales or wheezing Abd: soft, nontender, nondistended, BS present Ext: warm and well perfused, no pedal edema Neuro: alert  Lab Results: Basic Metabolic Panel:  Recent Labs Lab 11/18/13 0650 11/18/13 1845 11/19/13 0230 11/20/13 0334  NA 132* 133* 133* 132*  K 3.6* 3.7 3.8 3.5*  CL 94* 96 99 95*  CO2 22 23 21  13*  GLUCOSE 134* 140* 143* 194*  BUN 25* 28* 27* 28*  CREATININE 1.23* 1.25* 1.04 1.73*  CALCIUM 8.7 9.1 8.8 9.1  MG  --   --  2.0  --   PHOS  --  3.2  --   --    Liver Function Tests:  Recent Labs Lab 2013/06/19 1843  AST 16  ALT 10  ALKPHOS 53  BILITOT 0.5  PROT 11.0*  ALBUMIN 2.9*    Recent Labs Lab  2013/06/19 1843  LIPASE 12   No results found for this basename: AMMONIA,  in the last 168 hours CBC:  Recent Labs Lab 2013/06/19 1022 11/18/13 0650 11/20/13 0617  WBC 8.6 6.2 6.3  NEUTROABS 7.3  --  5.5  HGB 10.0* 10.0* 10.0*  HCT 30.2* 29.8* 29.5*  MCV 87.0 86.4 85.0  PLT 289 288 307   Cardiac Enzymes:  Recent Labs Lab 11/18/13 1845 11/19/13 0230 11/19/13 1641 11/19/13 2157 11/20/13 0334  CKTOTAL 482* 361*  --   --  200*  TROPONINI  --  <0.30 <0.30 <0.30  --    BNP:  Recent Labs Lab 11/15/13 2351 2013/06/19 1022  PROBNP 63.1 577.2*   D-Dimer: No results found for this basename: DDIMER,  in the last 168 hours CBG:  Recent Labs Lab 11/19/13 2047  GLUCAP 174*   Hemoglobin A1C: No results found for this basename: HGBA1C,  in the last 168 hours Fasting Lipid Panel: No results found for this basename: CHOL, HDL, LDLCALC, TRIG, CHOLHDL, LDLDIRECT,  in the last 168 hours Thyroid Function Tests:  Recent Labs Lab 2013/06/19 1333  TSH 1.380  FREET4 0.81   Coagulation: No results found for this  basename: LABPROT, INR,  in the last 168 hours Anemia Panel: No results found for this basename: VITAMINB12, FOLATE, FERRITIN, TIBC, IRON, RETICCTPCT,  in the last 168 hours Urine Drug Screen: Drugs of Abuse     Component Value Date/Time   LABOPIA NONE DETECTED 12-02-13 1335   COCAINSCRNUR NONE DETECTED 12-02-13 1335   LABBENZ NONE DETECTED 12-02-13 1335   AMPHETMU NONE DETECTED 12-02-13 1335   THCU NONE DETECTED 12-02-13 1335   LABBARB POSITIVE* 12-02-13 1335    Alcohol Level: No results found for this basename: ETH,  in the last 168 hours Urinalysis:  Recent Labs Lab 11/18/13 0640 11/18/13 1800  COLORURINE AMBER* AMBER*  LABSPEC 1.025 1.026  PHURINE 5.0 5.5  GLUCOSEU NEGATIVE NEGATIVE  HGBUR MODERATE* LARGE*  BILIRUBINUR SMALL* NEGATIVE  KETONESUR 15* NEGATIVE  PROTEINUR >300* 100*  UROBILINOGEN 1.0 1.0  NITRITE NEGATIVE NEGATIVE    LEUKOCYTESUR NEGATIVE NEGATIVE   Micro Results: No results found for this or any previous visit (from the past 240 hour(s)). Studies/Results: Dg Chest 2 View  11/19/2013   CLINICAL DATA:  Shortness of breath at rest for 5 days. Hypertension.  EXAM: CHEST  2 VIEW  COMPARISON:  CT and chest x-ray 12-02-13  FINDINGS: There are enlarging bilateral pleural effusions, now moderate. Bibasilar atelectasis or infiltrates. Cardiomegaly with vascular congestion. No overt edema.  No acute bony abnormality.  IMPRESSION: Enlarging moderate bilateral pleural effusions. Bibasilar atelectasis or infiltrates.  Cardiomegaly, vascular congestion.   Electronically Signed   By: Charlett NoseKevin  Dover M.D.   On: 11/19/2013 23:56   Medications: I have reviewed the patient's current medications. Scheduled Meds: . [START ON 11/21/2013] azithromycin  250 mg Intravenous Q24H  . azithromycin  500 mg Intravenous Once  . cefTRIAXone (ROCEPHIN)  IV  1 g Intravenous Q24H  . clonazePAM  1 mg Oral BID  . colchicine  0.6 mg Oral BID  . furosemide      . heparin  5,000 Units Subcutaneous 3 times per day  . insulin aspart  1-3 Units Subcutaneous 6 times per day  . loratadine  10 mg Oral Daily  . metoprolol  5 mg Intravenous Once  . pantoprazole  40 mg Oral Daily  . polyethylene glycol  17 g Oral Daily  . potassium chloride  40 mEq Oral Once  . sodium chloride  500 mL Intravenous Once   Continuous Infusions:  PRN Meds:.HYDROcodone-acetaminophen, morphine injection, ondansetron (ZOFRAN) IV Assessment/Plan:   Acute respiratory failure with hypoxia - Patient has been transferred to step down and PCCM is on board.  Patient has worsening pleural effusions, worsening, renal function, and suspicion of pericarditis. Auto immune cause of her constilation of symptoms is in the differential and studies are pending.  If ANA positive will start pulse steroids.  She does not have a cough, leukocytosis, or fever but concern has been raised that  pneumonia could explain her symptoms and she has been started on empiric CAP coverage. -Given her respiratory distress will place patient on BIPAP - Follow cultures, start Ceftriaxone and Azithromycin - Follow autoimmune workup - Repeat CXR at 11am - Repeat ABG on bipap -Patient has had 2 echos this admission without signs of dysfuction (small pericardial effusion) but has elevated proBNP and has been given 1 dose of IV lasix with little response.    GERD (gastroesophageal reflux disease) -Continue protonix  Tachycardia Appears sinus, troponin's have not been elevated, will repeat another EKG now.    Benign essential HTN - Mildly hypertensive  Presumed Pericarditis - Ibuprofen, Colcrys D/C given ARF -? Lupus versus other autoimmune disease as cause    Acute renal failure - Patient with acute rise in Scr without rise in BUN, she is at risk for CIAKI given her recent CT imaging on 10/21 and 10/22, she was also treated with NSAIDs for presumed pericarditis. And she may have an intrinsic renal autoimmune cause like Lupus - Repeat U/A -Check Urine Urea, Urine Cr. - Patient appears clinically dehydrated but with pleural effusions, will give 500cc IVF bolus now.   Dispo: Disposition is deferred at this time, awaiting improvement of current medical problems.    The patient does have a current PCP Britt Bottom Corliss Marcus, MD) and does not need an [facility] hospital follow-up appointment after discharge.  The patient does not have transportation limitations that hinder transportation to clinic appointments.  .Services Needed at time of discharge: Y = Yes, Blank = No PT:   OT:   RN:   Equipment:   Other:     LOS: 3 days   Gust Rung, DO 11/20/2013, 8:33 AM

## 2013-11-20 NOTE — Progress Notes (Signed)
914fr Foley placed per MD order. Matt HolmesKenyetta Eason RN as second person present during insertion. Peri care completed prior to insertion. Sterile technique maintained throughout procedure. Small amount amber urine returned upon insertion. Pt tolerated procedure well.

## 2013-11-20 NOTE — Progress Notes (Signed)
Date: 11/20/2013  Patient name: Courtney Cortez  Medical record number: [id number]  Date of birth: 10/22/1975   This patient has been seen and the plan of care was discussed with the house staff. Please see their note for complete details. I concur with their findings with the following additions/corrections:  Patient with acute worsening this AM.  Per reports from night team, patient with increased HR, difficulty taking a deep breath, and diaphoresis.  Night team called and an ABG was done which showed a acidosis, low CO2 and hypoxia. She was noted on CXR to have worsening bilateral pleural effusions.  She was also noted to have an elevated LA, anion gap (worsened from previous) and AKI.  She was transferred to step down.  On discussion with patient, she actually says that her chest pain is better and her breathing is "the same."  She is alert and speaking in partial sentences due to tachypnea, she is now using accessory muscles to breathe, which is new for her.  She is somewhat diaphoretic, but she states this is normal for her.  Nursing reports that she had very little urine output after lasix and now has 0 urine in bladder scan.  She is tachycardic, but it appears sinus, EKG now shows somewhat diffuse ST elevation, TnI was normal, BNP now elevated to 6K.  This is consistent with our previous diagnosis of pericarditis.  Ibuprofen has been held given renal function.  It is unclear why she has renal dysfunction this AM and is retaining fluid in pleural effusions.  Possibilities include acute injury from contrast loads which she had a few days ago, vs. Decreased PO intake given acute pericarditis and pain vs. An inflammatory process.  Given her findings of pleural effusions, pericarditis with minimal pericardial effusion and now AKI, possibility for an acute autoimmune process cannot be ruled out.      1. Pleural effusions, hypoxia - change in CT scan was noted on admission, however, patient was having  difficulty taking a deep breath and this was atributed to atelectasis.  She does have a normal WBC count, however, differential shows neutrophil predominance and bands.  She has been initated on treatment for CAP. This could also be related to rate related heart failure given her persistent tachycardia.  Will attempt to slow heart rate down today.  She is being placed on Bipap to help with her increased WOB.  Have seen her post Bipap and her accessory muscle use has decreased. BC were sent and we will follow them up.  Repeat CXR at noon today.  2. Chest pain, tachycardia, pericarditis - new EKG looks more consistent for pericarditis than acute MI, particularly given normal TnI and CK decreasing.  Will need to treat pain with IV medications, holding NSAIDs and colchicine due to renal dysfunction  3. Acute kidney injury - initiatlly improved, now worsened since yesterday.  Very little urine output with lasix.  Possible differentials noted above.  Will check urine studies, if urine can be obtained.  Have opted to give a small amount fluids given her decreased UOP.  Will monitor closely given concomitant pleural effusions.   4. Possible autoimmune disease - Spectrum of symptoms could be related to an acute lupus picture.  Will need to follow up all autoimmune labs, however, these are usually not resulted quickly.  If patient not improving, could consider pulse steroids.  If oxygenation proves difficult, renal function worsens, etc, PCCM is following and she may require intubation.    Our team  will monitor her closely in the SDU today.     Inez CatalinaEmily B Baylen Buckner, MD 11/20/2013, 8:41 AM

## 2013-11-20 NOTE — Progress Notes (Signed)
ABG    Component Value Date/Time   PHART 7.315* 11/20/2013 0506   PCO2ART 32.8* 11/20/2013 0506   PO2ART 59.4* 11/20/2013 0506   HCO3 16.2* 11/20/2013 0506   TCO2 17.2 11/20/2013 0506   ACIDBASEDEF 8.8* 11/20/2013 0506   O2SAT 86.7 11/20/2013 0506   Pt on RA.

## 2013-11-20 NOTE — Progress Notes (Signed)
Called to bedside d/t rapid response in progress d/t pt w/ increased WOB and SOB, diaphoretic, tachycardic.  Pt placed on bipap per order.  Pt tol well so far, decreased WOB noted, Sat 100%.  Pt is awake, alert and follows commands.  RN at bedside.

## 2013-11-20 NOTE — Progress Notes (Signed)
PULMONARY  / CRITICAL CARE MEDICINE CONSULTATION   Name: Courtney Cortez MRN: [id number] DOB: [birth year]    ADMISSION DATE:  11/20/2013 CONSULTATION DATE: 11/20/2013  REQUESTING CLINICIAN: Dr. Daryll Drown PRIMARY SERVICE: FM  CHIEF COMPLAINT:  SOB/CP  BRIEF PATIENT DESCRIPTION: 76 F with arthritis who presented [facility] with CP and SOB thought to be 2/2 pericarditis. Developed BL pleural effusions & Markedly tachypnic and tachycardic on 10/25 and PCCM asked to evaluate.   SIGNIFICANT EVENTS / STUDIES:  10/21 PE protocol CT >> No PE but bilateral "atelectasis" 10/22 PE Protocol CT >> No PE but worsened bilateral basilar disease 10/22 Echo >> Trivial pericardial effusion, No evidence of tamponade 10/23 HIV negative 10/24 Echo >> Trivial pericardial effusion, No evidence of tamponade 10/25 >> ABG 7.315/32.8/59.4 10/25 ESR 130  CK 492 --> 200  LINES / TUBES: LIJ 10/25 >> ETT 10/25 >>  CULTURES: Blood Culture x 2 10/25 Urine Culture 10/25 Sputum Culture 10/25 Resp Viral Panel 10/25  ANTIBIOTICS: CTX 10/25 - Azithro 10/25-   SUBJECTIVE: Examined on bipap High WOB C/o chest pain   VITAL SIGNS: Temp:  [97.2 F (36.2 C)-99.8 F (37.7 C)] 99.8 F (37.7 C) (10/25 1121) Pulse Rate:  [28-145] 28 (10/25 1300) Resp:  [16-36] 26 (10/25 1300) BP: (94-154)/(42-105) 132/67 mmHg (10/25 1300) SpO2:  [89 %-100 %] 89 % (10/25 1300) FiO2 (%):  [50 %] 50 % (10/25 1114) Weight:  [108 kg (238 lb 1.6 oz)-109 kg (240 lb 4.8 oz)] 109 kg (240 lb 4.8 oz) (10/25 0700) HEMODYNAMICS:   VENTILATOR SETTINGS: Vent Mode:  [-] BIPAP;PSV FiO2 (%):  [50 %] 50 % Set Rate:  [15 bmp] 15 bmp PEEP:  [5 cmH20] 5 cmH20 INTAKE / OUTPUT: Intake/Output     10/24 0701 - 10/25 0700 10/25 0701 - 10/26 0700   P.O.     IV Piggyback  250   Total Intake(mL/kg)  250 (2.3)   Urine (mL/kg/hr) 900 (0.3)    Total Output 900 0   Net -900 +250        Urine Occurrence  1 x     PHYSICAL EXAMINATION: General:   Obese F in moderate respiratory distress Neuro:  Intact HEENT:  Sclera anicteric, conjunctiva pink, MMM, OP clear  Neck: Trachea supple and midline, (-) LAN or JVD  Cardiovascular:  Tachy, RR, NS1/S2, (-) MRG Lungs:  CTAB Abdomen:  S/NT/ND/(+)BS Musculoskeletal:  (-) C/C, trace pitting edema bilaterally Skin:  Intact,purpuric  rash over BLEs  LABS:  CBC  Recent Labs Lab 11/16/2013 1022 11/18/13 0650 11/20/13 0617  WBC 8.6 6.2 6.3  HGB 10.0* 10.0* 10.0*  HCT 30.2* 29.8* 29.5*  PLT 289 288 307   Coag's No results found for this basename: APTT, INR,  in the last 168 hours BMET  Recent Labs Lab 11/18/13 1845 11/19/13 0230 11/20/13 0334  NA 133* 133* 132*  K 3.7 3.8 3.5*  CL 96 99 95*  CO2 23 21 13*  BUN 28* 27* 28*  CREATININE 1.25* 1.04 1.73*  GLUCOSE 140* 143* 194*   Electrolytes  Recent Labs Lab 11/18/13 1845 11/19/13 0230 11/20/13 0334  CALCIUM 9.1 8.8 9.1  MG  --  2.0  --   PHOS 3.2  --   --    Sepsis Markers  Recent Labs Lab 11/20/13 0617 11/20/13 0820 11/20/13 1352  LATICACIDVEN 6.0* 5.3* 4.4*   ABG  Recent Labs Lab 11/20/13 0506 11/20/13 1022  PHART 7.315* 7.317*  PCO2ART 32.8* 33.9*  PO2ART 59.4*  102.0*   Liver Enzymes  Recent Labs Lab 11/18/2013 1843  AST 16  ALT 10  ALKPHOS 53  BILITOT 0.5  ALBUMIN 2.9*   Cardiac Enzymes  Recent Labs Lab 11/15/13 2351 11/25/2013 1022  11/19/13 1641 11/19/13 2157 11/20/13 0820  TROPONINI  --  <0.30  < > <0.30 <0.30 <0.30  PROBNP 63.1 577.2*  --   --   --  6391.0*  < > = values in this interval not displayed. Glucose  Recent Labs Lab 11/19/13 2047 11/20/13 1003 11/20/13 1108  GLUCAP 174* 170* 156*    Imaging Dg Chest 2 View  11/19/2013   CLINICAL DATA:  Shortness of breath at rest for 5 days. Hypertension.  EXAM: CHEST  2 VIEW  COMPARISON:  CT and chest x-ray 11/15/2013  FINDINGS: There are enlarging bilateral pleural effusions, now moderate. Bibasilar atelectasis or  infiltrates. Cardiomegaly with vascular congestion. No overt edema.  No acute bony abnormality.  IMPRESSION: Enlarging moderate bilateral pleural effusions. Bibasilar atelectasis or infiltrates.  Cardiomegaly, vascular congestion.   Electronically Signed   By: Rolm Baptise M.D.   On: 11/19/2013 23:56   Dg Chest Port 1 View  11/20/2013   CLINICAL DATA:  38 year old female with acute respiratory failure. Evaluate central line placement.  EXAM: PORTABLE CHEST - 1 VIEW  COMPARISON:  Chest x-ray 11/20/2013.  FINDINGS: There is a left-sided internal jugular central venous catheter with tip terminating in the superior aspect of the right atrium. Lung volumes are low, and there continues to be extensive areas of atelectasis and/or consolidation throughout the lung bases bilaterally, with superimposed moderate to large bilateral pleural effusions. No pneumothorax. Moderate enlargement of the cardiopericardial silhouette is again noted.  IMPRESSION: 1. Tip of left internal jugular central venous catheter is in the superior aspect of the right atrium. 2. No pneumothorax. 3. Radiographic appearance of the chest is otherwise unchanged, as above.   Electronically Signed   By: Vinnie Langton M.D.   On: 11/20/2013 13:51   Dg Chest Port 1 View  11/20/2013   CLINICAL DATA:  38 year old female with acute respiratory failure and labored breathing.  EXAM: PORTABLE CHEST - 1 VIEW  COMPARISON:  Chest x-ray 11/19/2013.  FINDINGS: Lung volumes are low. Significantly worsening aeration throughout the mid to lower lungs bilaterally, compatible with areas of atelectasis and/or consolidation, with superimposed moderate large bilateral pleural effusions which appear to be increasing. Moderate enlargement of the cardiopericardial silhouette also appears to be worsening. Upper mediastinal contours are distorted by patient positioning.  IMPRESSION: 1. Significantly worsening aeration throughout the lung bases bilaterally, compatible with  increasing areas of atelectasis and/or consolidation, with increasing moderate to large bilateral pleural effusions. 2. Increasing enlargement of the cardiopericardial silhouette. This could reflect worsening cardiomegaly, or could indicate the presence of a developing pericardial effusion. Clinical correlation is recommended.   Electronically Signed   By: Vinnie Langton M.D.   On: 11/20/2013 12:10    EKG: Sinus Tach ,diffuse ST elevation CXR: Bl effusions, left appears loculated  ASSESSMENT / PLAN:  PULMONARY A: Acute hypoxic respiratory failure: DDx included HF, CAP, autoimmune pneumonitis/pleuritis. ? If there is an autoimmune process underlying all of her issues. Doubt aspiration P:   Intubated, vent bundle Autoimmune panel pending   CARDIOVASCULAR A: Pleuritic Chest Pain 2/2 presumed pericarditis: nml Tp P:     RENAL A: AKI: oliguricGiven the presence of CK again ? presence of unifying autoimmune process. 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  CRITICAL CARE: The patient is critically ill with multiple organ systems failure and requires high complexity decision making for assessment and support, frequent evaluation and titration of therapies, application of advanced  monitoring technologies and extensive interpretation of multiple databases. Critical Care Time devoted to patient care services described in this note is 45 minutes.   Kara Mead MD. Shade Flood.  Pulmonary & Critical care Pager [phone] If no response call [phone]    11/20/2013, 2:34 PM

## 2013-11-20 NOTE — Progress Notes (Signed)
EKG CRITICAL VALUE     12 lead EKG performed.  Critical value noted.  Raymon MuttonWhitney Davis,  RN notified.   Madoc Holquin, CCT 11/20/2013 11:30 AM

## 2013-11-20 NOTE — Progress Notes (Signed)
Error

## 2013-11-20 NOTE — Progress Notes (Signed)
Patient transferred from [facility] on tele by RN and NT via bed. Patient HR 140s, accessory muscle use, O2 Sats WNL on 2L oxygen [place], dyspnea at rest/exertion, RR 30-40s. Fatigued and diaphoretic. No family at bedside. Patient requesting to restrict all visitors at this time.

## 2013-11-20 NOTE — Progress Notes (Signed)
38yo female admitted 10/22 for CP and dyspnea, initial CXR showed linear atelectasis, CXR update shows possible PNA, to begin IV ABX.  Will start Rocephin 1g IV Q24H and monitor CBC and Cx.  Vernard GamblesVeronda Oreta Soloway, PharmD, BCPS 11/20/2013 7:07 AM

## 2013-11-20 NOTE — Procedures (Signed)
Intubation Procedure Note Courtney Cortez [id number] 09/20/1975  Procedure: Intubation Indications: Respiratory insufficiency  Procedure Details Consent: Risks of procedure as well as the alternatives and risks of each were explained to the (patient/caregiver).  Consent for procedure obtained. Time Out: Verified patient identification, verified procedure, site/side was marked, verified correct patient position, special equipment/implants available, medications/allergies/relevent history reviewed, required imaging and test results available.  Performed  Maximum sterile technique was used including antiseptics, cap, gloves and gown.  MAC and 3    Evaluation Hemodynamic Status: BP stable throughout; O2 sats: stable throughout Patient's Current Condition: stable Complications: No apparent complications Patient did tolerate procedure well. Chest X-ray ordered to verify placement.  CXR: pending.   BABCOCK,PETE 11/20/2013  Supervised by me, 2mg  versed, 100 mcg fentanyl & etomidate 20 mg used  Oretha MilchALVA,Courtney Harnack V. MD

## 2013-11-20 NOTE — Significant Event (Signed)
Rapid Response Event Note  Overview: Time Called: 0734 Arrival Time: 0740 Event Type: Respiratory;Cardiac    Initial Focused Assessment:  Called by primary RN, patient new admit to unit this am.  Patient is lethargic, but responsive to voice.  Patient is diaphoretic, using accessory muscles, increased WOB, skin is cool.  BP 132/92, HR 140's, RR 40's, Sat 92% on nasal cannula, temp 97.6.  Patient placed on NRB, Sats increased to 100%.     Interventions:  MD paged and at bedside, labs ordered and being drawn.  Respiratory therapy called for BIPAP,  New orders received, foley catheter being placed by primary RN.     Event Summary:  Rn to call if assistance needed   at      at          [facility], Court[facility]

## 2013-11-20 NOTE — Consult Note (Addendum)
Renal Service Consult Note [facility]  Baldemar Lenisonya Maynez 11/20/2013 Lerry Cordrey D Requesting Physician:  Dr Vassie LollAlva  Reason for Consult:  Acute renal failure HPI: The patient is a 38 y.o. year-old without specific PMH who presented to ED on 10/20 in the evening reporting severe chest pain radiating to the back.  Taking nsaid's w/o relief.  Had chest CT angio which negative for PE or other pathology and was dc'd home.  Returned to ED on 10/22 in the morning with worsening pleuritic CP.  Pt was admitted and CT angio of chest was repeated showing this time a consolidation of the R lower lobe and less consolidation of the L lower lobe.  She was admitted and rx with high dose NSAID's, Norco, colchicine for possible pericarditis. This am she became more SOB and CCM was called for respiratory distress. Pt was moved to ICU and put on Bipap.  She did not improve and was intubated this afternoon.  BP is very low, she is now on pressors and is not making any urine. Creat has increased from  0.98 on 10/20 up to 1.73 today. BUN 28, CON 13, K 3.5, Na 135.   ROS  n/a  Past Medical History  Past Medical History  Diagnosis Date  . Acid reflux disease   . Hypertension   . Arthritis     "joints"   Past Surgical History  Past Surgical History  Procedure Laterality Date  . Cesarean section  1997   Family History  Family History  Problem Relation Age of Onset  . Hypertension Mother   . Diabetes Father   . Hypertension Father    Social History  reports that she has never smoked. She has never used smokeless tobacco. She reports that she drinks alcohol. She reports that she does not use illicit drugs. Allergies No Known Allergies Home medications Prior to Admission medications   Medication Sig Start Date End Date Taking? Authorizing Provider  aspirin EC 325 MG tablet Take 325 mg by mouth once.   Yes Historical Provider, MD  hydrochlorothiazide (HYDRODIURIL) 25 MG tablet Take 25 mg by mouth  daily.   Yes Historical Provider, MD  ibuprofen (ADVIL,MOTRIN) 200 MG tablet Take 400 mg by mouth every 6 (six) hours as needed for pain.   Yes Historical Provider, MD  loratadine (CLARITIN) 10 MG tablet Take 10 mg by mouth daily.   Yes Historical Provider, MD  meloxicam (MOBIC) 7.5 MG tablet Take 7.5 mg by mouth daily.   Yes Historical Provider, MD  HYDROcodone-acetaminophen (NORCO/VICODIN) 5-325 MG per tablet Take 1 tablet by mouth 2 (two) times daily as needed for severe pain. 11/16/13   Tomasita CrumbleAdeleke Oni, MD   Liver Function Tests  Recent Labs Lab 11/11/2013 1843  AST 16  ALT 10  ALKPHOS 53  BILITOT 0.5  PROT 11.0*  ALBUMIN 2.9*    Recent Labs Lab 11/11/2013 1843  LIPASE 12   CBC  Recent Labs Lab 11/08/2013 1022 11/18/13 0650 11/20/13 0617  WBC 8.6 6.2 6.3  NEUTROABS 7.3  --  5.5  HGB 10.0* 10.0* 10.0*  HCT 30.2* 29.8* 29.5*  MCV 87.0 86.4 85.0  PLT 289 288 307   Basic Metabolic Panel  Recent Labs Lab 11/15/13 2351 10/28/2013 1022 11/18/13 0650 11/18/13 1845 11/19/13 0230 11/20/13 0334  NA 131* 133* 132* 133* 133* 132*  K 3.6* 3.4* 3.6* 3.7 3.8 3.5*  CL 98 97 94* 96 99 95*  CO2 24 24 22 23 21  13*  GLUCOSE 107*  117* 134* 140* 143* 194*  BUN 8 16 25* 28* 27* 28*  CREATININE 0.71 0.93 1.23* 1.25* 1.04 1.73*  CALCIUM 9.1 9.0 8.7 9.1 8.8 9.1  PHOS  --   --   --  3.2  --   --     Filed Vitals:   11/20/13 1400 11/20/13 1428 11/20/13 1500 11/20/13 1602  BP:  98/42 81/36   Pulse:  134 133   Temp:    98.4 F (36.9 C)  TempSrc:    Axillary  Resp: 32 25 24   Height:      Weight:      SpO2:   100%    Exam On vent, biting the tube, trying to pull tube out, not sedated yet No rash, cyanosis or gangrene Sclera anicteric No JVD Chest scattered coarse rales, no wheezing, good air movement RRR no MRG Abd soft, nondistended, +BS Mild nonpitting edema LE's Neuro not available, moving all ext   UA 11/18/13 > 1.026, 5.5, 100 prot, neg LE/nitrite, large Hb, 0-2 wbc,  0-2 rbc, few bact, hyaline casts CXR 11/18/13 > bibasilar airspace disease CT angio chest 10/21 > 100cc dye, mild atx bilat bases, no PE CT angio chest 10/22 > 80cc dye, new dense consolidation R lower lobe w effusion, milder consolidation LLL CT abd (no contrast) - normal kidneys taking up contrast without hydro   Assessment: 1 Acute renal failure - in previously healthy pt with chest pain, pulm infiltrates, acute resp failure and shock on pressors. I don't have any renal evidence of acute GN. The UA prior to decompensation was not very remarkable.  There is no urine to examine today. It would be unusual for renal function to decline this rapidly from a GN alone, and especially with a benign UA two days ago. She has however been getting very high doses of NSAID"s po and also IV toradol, as well she has received two separate boluses of IV contrast last Tues and Wed night. Suspect she may have ATN from hypotension/ contrast / NSAID's.  There is no platelet abnormality to suggest a TMA. For now would recommend supportive care as you're doing, check renal US in am. Will follow 2 Shock, r/o sepsis - on pressors 3 Acute resp failure / pulm infiltrates / pleural effusion - on IV Rocephin/Zmax, w/u in progress, possible PNA vs other  Plan- as above, will follow  Vinson Moselleob Laysa Kimmey MD (pgr) [phone]    ([phone]) [phone] 11/20/2013, 4:58 PM

## 2013-11-20 NOTE — Consult Note (Signed)
PULMONARY  / CRITICAL CARE MEDICINE CONSULTATION   Name: Courtney Cortez MRN: [id number] DOB: 1975-10-22    ADMISSION DATE:  10/28/2013 CONSULTATION DATE: 11/20/2013  REQUESTING CLINICIAN: Dr. Daryll Drown PRIMARY SERVICE: FM  CHIEF COMPLAINT:  SOB/CP  BRIEF PATIENT DESCRIPTION: 68 F with arthritis who presented to [facility] on 10/22 with CP and SOB thought to be 2/2 pericarditis. Markedly tachypnic and tachycardic on 10/25 and PCCM asked to evaluate.   SIGNIFICANT EVENTS / STUDIES:  10/21 PE protocol CT >> No PE but bilateral "atelectasis" 10/22 PE Protocol CT >> No PE but worsened bilateral basilar disease 10/22 Echo >> Trivial pericardial effusion, No evidence of tamponade 10/23 HIV negative 10/24 Echo >> Trivial pericardial effusion, No evidence of tamponade 10/25 >> ABG 7.315/32.8/59.4 10/25 ESR 130  CK 492 --> 200  LINES / TUBES: PIVs  CULTURES: Blood Culture x 2 10/25 Urine Culture 10/25 Sputum Culture 10/25 Resp Viral Panel 10/25  ANTIBIOTICS: CTX 10/25 - Azithro 10/25-  HISTORY OF PRESENT ILLNESS:  Courtney Cortez is a 38 yo F with GERD and arthritis NOS who presented to [facility] on 10/22 with shortness of breath and pleuritic chest pain. The symptoms started 1 day prior to admission. There was no associated fevers or chills. She did notice some clear sputum production associated with a cough. She notes mild Sicca symptoms and some joint pains; she denies rash, aspiration, and skin tightening. She was admitted and treated for presumed pericarditis. PCCM was called the morning of 10/25 for severe tachypnea and tachycardia.  PAST MEDICAL HISTORY :  Past Medical History  Diagnosis Date  . Acid reflux disease   . Hypertension   . Arthritis     "joints"   Past Surgical History  Procedure Laterality Date  . Cesarean section  1997   Prior to Admission medications   Medication Sig Start Date End Date Taking? Authorizing Provider  aspirin EC 325 MG tablet Take 325 mg by mouth once.   Yes  Historical Provider, MD  hydrochlorothiazide (HYDRODIURIL) 25 MG tablet Take 25 mg by mouth daily.   Yes Historical Provider, MD  ibuprofen (ADVIL,MOTRIN) 200 MG tablet Take 400 mg by mouth every 6 (six) hours as needed for pain.   Yes Historical Provider, MD  loratadine (CLARITIN) 10 MG tablet Take 10 mg by mouth daily.   Yes Historical Provider, MD  meloxicam (MOBIC) 7.5 MG tablet Take 7.5 mg by mouth daily.   Yes Historical Provider, MD  HYDROcodone-acetaminophen (NORCO/VICODIN) 5-325 MG per tablet Take 1 tablet by mouth 2 (two) times daily as needed for severe pain. 11/16/13   Everlene Balls, MD   No Known Allergies  FAMILY HISTORY:  Family History  Problem Relation Age of Onset  . Hypertension Mother   . Diabetes Father   . Hypertension Father    SOCIAL HISTORY:  reports that she has never smoked. She has never used smokeless tobacco. She reports that she drinks alcohol. She reports that she does not use illicit drugs.  REVIEW OF SYSTEMS:  A 12-system ROS was conducted and, unless otherwise specified in the HPI, was negative.   SUBJECTIVE:   VITAL SIGNS: Temp:  [97.2 F (36.2 C)-98.2 F (36.8 C)] 97.8 F (36.6 C) (10/25 0608) Pulse Rate:  [105-145] 105 (10/25 0608) Resp:  [22] 22 (10/25 0608) BP: (129-154)/(73-105) 129/77 mmHg (10/25 0608) SpO2:  [91 %-100 %] 91 % (10/25 0109) Weight:  [238 lb 1.6 oz (108 kg)] 238 lb 1.6 oz (108 kg) (10/25 3235) HEMODYNAMICS:   VENTILATOR  SETTINGS:   INTAKE / OUTPUT: Intake/Output     10/24 0701 - 10/25 0700   Urine (mL/kg/hr) 900 (0.3)   Total Output 900   Net -900         PHYSICAL EXAMINATION: General:  Obese F in mild respiratory distress Neuro:  Intact HEENT:  Sclera anicteric, conjunctiva pink, MMM, OP clear  Neck: Trachea supple and midline, (-) LAN or JVD  Cardiovascular:  Tachy, RR, NS1/S2, (-) MRG Lungs:  CTAB Abdomen:  S/NT/ND/(+)BS Musculoskeletal:  (-) C/C, trace pitting edema bilaterally Skin:   Intact  LABS:  CBC  Recent Labs Lab 11/15/13 2351 11/20/2013 1022 11/18/13 0650  WBC 6.6 8.6 6.2  HGB 9.4* 10.0* 10.0*  HCT 28.1* 30.2* 29.8*  PLT 318 289 288   Coag's No results found for this basename: APTT, INR,  in the last 168 hours BMET  Recent Labs Lab 11/18/13 1845 11/19/13 0230 11/20/13 0334  NA 133* 133* 132*  K 3.7 3.8 3.5*  CL 96 99 95*  CO2 23 21 13*  BUN 28* 27* 28*  CREATININE 1.25* 1.04 1.73*  GLUCOSE 140* 143* 194*   Electrolytes  Recent Labs Lab 11/18/13 1845 11/19/13 0230 11/20/13 0334  CALCIUM 9.1 8.8 9.1  MG  --  2.0  --   PHOS 3.2  --   --    Sepsis Markers  Recent Labs Lab 11/18/13 0725  LATICACIDVEN 2.2   ABG  Recent Labs Lab 11/20/13 0506  PHART 7.315*  PCO2ART 32.8*  PO2ART 59.4*   Liver Enzymes  Recent Labs Lab 11/18/2013 1843  AST 16  ALT 10  ALKPHOS 53  BILITOT 0.5  ALBUMIN 2.9*   Cardiac Enzymes  Recent Labs Lab 11/15/13 2351 11/12/2013 1022  11/19/13 0230 11/19/13 1641 11/19/13 2157  TROPONINI  --  <0.30  < > <0.30 <0.30 <0.30  PROBNP 63.1 577.2*  --   --   --   --   < > = values in this interval not displayed. Glucose  Recent Labs Lab 11/19/13 2047  GLUCAP 174*    Imaging Dg Chest 2 View  11/19/2013   CLINICAL DATA:  Shortness of breath at rest for 5 days. Hypertension.  EXAM: CHEST  2 VIEW  COMPARISON:  CT and chest x-ray 11/10/2013  FINDINGS: There are enlarging bilateral pleural effusions, now moderate. Bibasilar atelectasis or infiltrates. Cardiomegaly with vascular congestion. No overt edema.  No acute bony abnormality.  IMPRESSION: Enlarging moderate bilateral pleural effusions. Bibasilar atelectasis or infiltrates.  Cardiomegaly, vascular congestion.   Electronically Signed   By: Rolm Baptise M.D.   On: 11/19/2013 23:56    EKG: Reviewed. Sinus Tach CXR: Reviewed  ASSESSMENT / PLAN:  PULMONARY A: Acute hypoxic respiratory failure: DDx included HF, CAP, autoimmune  pneumonitis/pleuritis. ? If there is an autoimmune process underlying all of her issues. Alternatively, given presence of barbiturates on arrival ? if patient aspirated prior to arrival and this is all aspiration pneumonia.  P:   Supplemental O2 to maintain O2 sat >= 92% Low threshhold for ICU transfer + CPAP/BiPAP initiation Autoimmune panel CAP coverage Sputum Culture  CARDIOVASCULAR A: Pleuritic Chest Pain 2/2 presumed pericarditis:  P:   Follow-up cards  RENAL A: AKI: Given the presence of CK again ? presence of unifying autoimmune process. AG Metabolic acidosis with respiratory compensation: Hypokalemia: P:   Serial Lactates Serial BMPs Autoimmune panel Check Urine Lytes Can consider early renal consult if autoimmune panel positive for renal biopsy Replete K  GASTROINTESTINAL A:  GERD  P:   Cont PPI  HEMATOLOGIC A: Anemia:  P:   Check iron studies  INFECTIOUS A: Possible CAP:  P:   Empiric CTX, azithro Pan-Culture  ENDOCRINE A: Hyperglycemia:  P:   SSI  A1c  NEUROLOGIC A: No acute issue  TODAY'S SUMMARY:   I have personally obtained a history, examined the patient, evaluated laboratory and imaging results, formulated the assessment and plan and placed orders.  CRITICAL CARE: The patient is critically ill with multiple organ systems failure and requires high complexity decision making for assessment and support, frequent evaluation and titration of therapies, application of advanced monitoring technologies and extensive interpretation of multiple databases. Critical Care Time devoted to patient care services described in this note is 45 minutes.   Margarette Asal, MD Pulmonary and [place] Pager: [phone]   11/20/2013, 6:29 AM

## 2013-11-21 ENCOUNTER — Inpatient Hospital Stay (HOSPITAL_COMMUNITY): Payer: PRIVATE HEALTH INSURANCE

## 2013-11-21 LAB — GLUCOSE, CAPILLARY
GLUCOSE-CAPILLARY: 137 mg/dL — AB (ref 70–99)
GLUCOSE-CAPILLARY: 139 mg/dL — AB (ref 70–99)
Glucose-Capillary: 119 mg/dL — ABNORMAL HIGH (ref 70–99)
Glucose-Capillary: 124 mg/dL — ABNORMAL HIGH (ref 70–99)
Glucose-Capillary: 128 mg/dL — ABNORMAL HIGH (ref 70–99)
Glucose-Capillary: 145 mg/dL — ABNORMAL HIGH (ref 70–99)
Glucose-Capillary: 149 mg/dL — ABNORMAL HIGH (ref 70–99)

## 2013-11-21 LAB — BASIC METABOLIC PANEL
Anion gap: 20 — ABNORMAL HIGH (ref 5–15)
BUN: 51 mg/dL — ABNORMAL HIGH (ref 6–23)
CHLORIDE: 97 meq/L (ref 96–112)
CO2: 18 meq/L — AB (ref 19–32)
Calcium: 8.6 mg/dL (ref 8.4–10.5)
Creatinine, Ser: 3.58 mg/dL — ABNORMAL HIGH (ref 0.50–1.10)
GFR calc Af Amer: 18 mL/min — ABNORMAL LOW (ref >90)
GFR calc non Af Amer: 15 mL/min — ABNORMAL LOW (ref >90)
GLUCOSE: 133 mg/dL — AB (ref 70–99)
POTASSIUM: 4.3 meq/L (ref 3.7–5.3)
Sodium: 135 mEq/L — ABNORMAL LOW (ref 137–147)

## 2013-11-21 LAB — HEPATITIS B SURFACE ANTIGEN: Hepatitis B Surface Ag: NEGATIVE

## 2013-11-21 LAB — STREP PNEUMONIAE URINARY ANTIGEN: Strep Pneumo Urinary Antigen: POSITIVE — AB

## 2013-11-21 LAB — CBC
HEMATOCRIT: 24.2 % — AB (ref 36.0–46.0)
Hemoglobin: 8.3 g/dL — ABNORMAL LOW (ref 12.0–15.0)
MCH: 28.6 pg (ref 26.0–34.0)
MCHC: 34.3 g/dL (ref 30.0–36.0)
MCV: 83.4 fL (ref 78.0–100.0)
Platelets: 191 10*3/uL (ref 150–400)
RBC: 2.9 MIL/uL — AB (ref 3.87–5.11)
RDW: 15.1 % (ref 11.5–15.5)
WBC: 12.2 10*3/uL — ABNORMAL HIGH (ref 4.0–10.5)

## 2013-11-21 LAB — RENAL FUNCTION PANEL
Albumin: 1.8 g/dL — ABNORMAL LOW (ref 3.5–5.2)
Anion gap: 19 — ABNORMAL HIGH (ref 5–15)
BUN: 64 mg/dL — ABNORMAL HIGH (ref 6–23)
CALCIUM: 8.3 mg/dL — AB (ref 8.4–10.5)
CO2: 19 mEq/L (ref 19–32)
Chloride: 95 mEq/L — ABNORMAL LOW (ref 96–112)
Creatinine, Ser: 4.18 mg/dL — ABNORMAL HIGH (ref 0.50–1.10)
GFR calc non Af Amer: 13 mL/min — ABNORMAL LOW (ref >90)
GFR, EST AFRICAN AMERICAN: 15 mL/min — AB (ref >90)
Glucose, Bld: 128 mg/dL — ABNORMAL HIGH (ref 70–99)
POTASSIUM: 4.1 meq/L (ref 3.7–5.3)
Phosphorus: 5.3 mg/dL — ABNORMAL HIGH (ref 2.3–4.6)
Sodium: 133 mEq/L — ABNORMAL LOW (ref 137–147)

## 2013-11-21 LAB — PHOSPHORUS: Phosphorus: 4.8 mg/dL — ABNORMAL HIGH (ref 2.3–4.6)

## 2013-11-21 LAB — URINALYSIS, ROUTINE W REFLEX MICROSCOPIC
GLUCOSE, UA: 100 mg/dL — AB
KETONES UR: 15 mg/dL — AB
Nitrite: NEGATIVE
PH: 5 (ref 5.0–8.0)
Specific Gravity, Urine: 1.036 — ABNORMAL HIGH (ref 1.005–1.030)
Urobilinogen, UA: 1 mg/dL (ref 0.0–1.0)

## 2013-11-21 LAB — ANA: ANA: POSITIVE — AB

## 2013-11-21 LAB — URINE MICROSCOPIC-ADD ON

## 2013-11-21 LAB — TRIGLYCERIDES: TRIGLYCERIDES: 466 mg/dL — AB (ref <150)

## 2013-11-21 LAB — UREA NITROGEN, URINE: UREA NITROGEN UR: 81 mg/dL

## 2013-11-21 LAB — HEPATITIS C ANTIBODY (REFLEX): HCV Ab: NEGATIVE

## 2013-11-21 LAB — ANTI-NUCLEAR AB-TITER (ANA TITER)

## 2013-11-21 LAB — MAGNESIUM: Magnesium: 2.7 mg/dL — ABNORMAL HIGH (ref 1.5–2.5)

## 2013-11-21 LAB — HEPATITIS B CORE ANTIBODY, IGM: Hep B C IgM: NONREACTIVE

## 2013-11-21 MED ORDER — MIDAZOLAM HCL 2 MG/2ML IJ SOLN
2.0000 mg | INTRAMUSCULAR | Status: DC | PRN
  Administered 2013-11-24: 2 mg via INTRAVENOUS
  Filled 2013-11-21: qty 2

## 2013-11-21 MED ORDER — VANCOMYCIN HCL 10 G IV SOLR
2500.0000 mg | Freq: Once | INTRAVENOUS | Status: AC
  Administered 2013-11-21: 2500 mg via INTRAVENOUS
  Filled 2013-11-21: qty 2500

## 2013-11-21 MED ORDER — VITAL HIGH PROTEIN PO LIQD
1000.0000 mL | ORAL | Status: DC
  Administered 2013-11-21 – 2013-11-22 (×2): 1000 mL
  Filled 2013-11-21 (×3): qty 1000

## 2013-11-21 MED ORDER — MIDAZOLAM HCL 2 MG/2ML IJ SOLN: 2.0000 mg | INTRAMUSCULAR | Status: DC | PRN

## 2013-11-21 MED ORDER — STERILE WATER FOR INJECTION IV SOLN
INTRAVENOUS | Status: DC
  Administered 2013-11-21 – 2013-11-23 (×2): via INTRAVENOUS
  Filled 2013-11-21 (×5): qty 850

## 2013-11-21 MED ORDER — HYDROCORTISONE NA SUCCINATE PF 100 MG IJ SOLR
50.0000 mg | Freq: Four times a day (QID) | INTRAMUSCULAR | Status: DC
  Administered 2013-11-21 – 2013-11-23 (×9): 50 mg via INTRAVENOUS
  Filled 2013-11-21 (×12): qty 1

## 2013-11-21 MED ORDER — DEXMEDETOMIDINE HCL IN NACL 200 MCG/50ML IV SOLN
0.0000 ug/kg/h | INTRAVENOUS | Status: DC
  Administered 2013-11-21 (×2): 0.2 ug/kg/h via INTRAVENOUS
  Administered 2013-11-22 (×2): 0.6 ug/kg/h via INTRAVENOUS
  Administered 2013-11-22 (×2): 0.2 ug/kg/h via INTRAVENOUS
  Administered 2013-11-23: 0.8 ug/kg/h via INTRAVENOUS
  Administered 2013-11-23: 0.6 ug/kg/h via INTRAVENOUS
  Administered 2013-11-23: 0.2 ug/kg/h via INTRAVENOUS
  Administered 2013-11-23: 0.8 ug/kg/h via INTRAVENOUS
  Administered 2013-11-23 (×2): 0.6 ug/kg/h via INTRAVENOUS
  Administered 2013-11-24 (×4): 1 ug/kg/h via INTRAVENOUS
  Administered 2013-11-24: 0.6 ug/kg/h via INTRAVENOUS
  Administered 2013-11-24 (×2): 1 ug/kg/h via INTRAVENOUS
  Filled 2013-11-21 (×19): qty 50

## 2013-11-21 MED ORDER — PRO-STAT SUGAR FREE PO LIQD
30.0000 mL | Freq: Three times a day (TID) | ORAL | Status: DC
  Administered 2013-11-21 – 2013-11-25 (×13): 30 mL via ORAL
  Administered 2013-11-26: 08:00:00 via ORAL
  Filled 2013-11-21 (×17): qty 30

## 2013-11-21 NOTE — Progress Notes (Addendum)
ANTIBIOTIC CONSULT NOTE - INITIAL  Pharmacy Consult for vancomycin Indication: bacteremia  No Known Allergies  Patient Measurements: Height: 5\' 8"  (172.7 cm) Weight: 244 lb 4.3 oz (110.8 kg) IBW/kg (Calculated) : 63.9  Vital Signs: Temp: 99.5 F (37.5 C) (10/26 0405) Temp Source: Oral (10/26 0405) BP: 123/73 mmHg (10/26 0615) Pulse Rate: 116 (10/26 0615)  Labs:  Recent Labs  11/18/13 0650  11/19/13 0230 11/20/13 0334 11/20/13 0617 11/20/13 2215 11/21/13 0450  WBC 6.2  --   --   --  6.3  --  12.2*  HGB 10.0*  --   --   --  10.0*  --  8.3*  PLT 288  --   --   --  307  --  191  LABCREA  --   --   --   --   --  316.85  --   CREATININE 1.23*  < > 1.04 1.73*  --   --  3.58*  < > = values in this interval not displayed. Estimated Creatinine Clearance: 28.1 ml/min (by C-G formula based on Cr of 3.58).    Microbiology: Recent Results (from the past 720 hour(s))  CULTURE, BLOOD (ROUTINE X 2)     Status: None   Collection Time    11/20/13  8:20 AM      Result Value Ref Range Status   Specimen Description BLOOD RIGHT ARM   Final   Special Requests BOTTLES DRAWN AEROBIC AND ANAEROBIC 10CC   Final   Culture  Setup Time     Final   Value: 11/20/2013 22:31     Performed at [facility]   Culture     Final   Value: GRAM POSITIVE COCCI IN PAIRS     Note: Gram Stain Report Called to,Read Back By and Verified With: CHRIS HAYES @ 6:30AM 10.26.15 BY DESIS     Performed at [facility]   Report Status PENDING   Incomplete  MRSA PCR SCREENING     Status: None   Collection Time    11/20/13  8:30 AM      Result Value Ref Range Status   MRSA by PCR NEGATIVE  NEGATIVE Final   Comment:            The GeneXpert MRSA Assay (FDA     approved for NASAL specimens     only), is one component of a     comprehensive MRSA colonization     surveillance program. It is not     intended to diagnose MRSA     infection nor to guide or     monitor treatment for     MRSA  infections.  CULTURE, BLOOD (ROUTINE X 2)     Status: None   Collection Time    11/20/13 10:00 AM      Result Value Ref Range Status   Specimen Description BLOOD LEFT FOREARM   Final   Special Requests BOTTLES DRAWN AEROBIC ONLY 5CC   Final   Culture  Setup Time     Final   Value: 11/20/2013 22:32     Performed at [facility]   Culture     Final   Value: GRAM POSITIVE COCCI IN PAIRS     Note: Gram Stain Report Called to,Read Back By and Verified With: CHRIS HAYES @ 6:30AM 10.26.15 BY DESIS     Performed at [facility]   Report Status PENDING   Incomplete    Medical History: Past  Medical History  Diagnosis Date  . Acid reflux disease   . Hypertension   . Arthritis     "joints"    Medications:  Prescriptions prior to admission  Medication Sig Dispense Refill  . aspirin EC 325 MG tablet Take 325 mg by mouth once.      . hydrochlorothiazide (HYDRODIURIL) 25 MG tablet Take 25 mg by mouth daily.      Marland Kitchen. ibuprofen (ADVIL,MOTRIN) 200 MG tablet Take 400 mg by mouth every 6 (six) hours as needed for pain.      Marland Kitchen. loratadine (CLARITIN) 10 MG tablet Take 10 mg by mouth daily.      . meloxicam (MOBIC) 7.5 MG tablet Take 7.5 mg by mouth daily.      Marland Kitchen. HYDROcodone-acetaminophen (NORCO/VICODIN) 5-325 MG per tablet Take 1 tablet by mouth 2 (two) times daily as needed for severe pain.  6 tablet  0   Scheduled:  . antiseptic oral rinse  7 mL Mouth Rinse q12n4p  . antiseptic oral rinse  7 mL Mouth Rinse QID  . azithromycin  250 mg Intravenous Q24H  . cefTRIAXone (ROCEPHIN)  IV  1 g Intravenous Q24H  . chlorhexidine  15 mL Mouth Rinse BID  . clonazePAM  1 mg Oral BID  . heparin  5,000 Units Subcutaneous 3 times per day  . insulin aspart  1-3 Units Subcutaneous 6 times per day  . loratadine  10 mg Oral Daily  . methylPREDNISolone (SOLU-MEDROL) injection  125 mg Intravenous Q6H  . pantoprazole (PROTONIX) IV  40 mg Intravenous Daily  . polyethylene glycol  17 g Oral Daily    Infusions:  . fentaNYL infusion INTRAVENOUS 80 mcg/hr (11/20/13 2313)  . phenylephrine (NEO-SYNEPHRINE) Adult infusion 25 mcg/min (11/21/13 0630)  . propofol 20.031 mcg/kg/min (11/21/13 0604)    Assessment: 38yo female admitted 10/22 for CP w/ dyspnea, tx'd to MICU for increased WOB, intubated yesterday, now w/ GPC in pairs in blood Cx, to begin IV ABX; of note SCr was 0.71 on admission, up to 1.73 yesterday, now 3.58.  Goal of Therapy:  Vancomycin trough level 15-20 mcg/ml  Plan:  Will give vancomycin 2500mg  IV x1 now and f/u renal function prior to adding maintenance doses.  Vernard GamblesVeronda Bryk, PharmD, BCPS  11/21/2013,6:47 AM   Addendum: SCr continues to rise. Patient is now anuric. May start CRRT tomorrow. No dialysis plans today.  Plan: Continue to hold further doses of vancomycin. Will follow-up renal function tomorrow - likely will check level on Wednesday to determine next dose.   Link SnufferJessica Breeonna Mone, PharmD, BCPS Clinical Pharmacist [phone] 11/21/2013, 2:53 PM

## 2013-11-21 NOTE — Progress Notes (Addendum)
INITIAL NUTRITION ASSESSMENT  DOCUMENTATION CODES Per approved criteria  -Obesity Unspecified   INTERVENTION: Initiate TF via OG tube with Vital HP at 25 ml/h and Prostat 30 ml BID on day 1; on day 2, increase to goal rate of 40 ml/h (960 ml per day) with Prostat 30 ml TID to provide 1260 kcals, 129 gm protein, 806 ml free water daily.  Propofol provides additional 346 kcals.  Recommended TF regimen provides 1606 kcal (71% of estimated needs) and 129 g protein (100% of estimated needs).  NUTRITION DIAGNOSIS: Inadequate oral intake related to inability to eat as evidenced by NPO.   Goal: Enteral nutrition to provide 60-70% of estimated calorie needs (22-25 kcals/kg ideal body weight) and 100% of estimated protein needs, based on ASPEN guidelines for hypocaloric, high protein feeding in critically ill obese individuals  Monitor:  Initiation and toleration of TF, I/O's, weight trend, labs, vent status/settings  Reason for Assessment: Consult received for TF initiation and management  38 y.o. female  Admitting Dx: Acute respiratory failure with hypoxia  ASSESSMENT: 38 y.o. year-old without specific PMH who presented to ED on 10/20 in the evening reporting severe chest pain radiating to the back. Taking nsaid's w/o relief. Had chest CT angio which negative for PE or other pathology and was dc'd home. Returned to ED on 10/22 in the morning with worsening pleuritic CP. Pt was admitted and CT angio of chest was repeated showing this time a consolidation of the R lower lobe and less consolidation of the L lower lobe. She was admitted and rx with high dose NSAID's, Norco, colchicine for possible pericarditis. This am she became more SOB and CCM was called for respiratory distress. Pt was moved to ICU and put on Bipap. She did not improve and was intubated this afternoon.  - Pt with no signs of fat or muscle wasting.   Patient is currently intubated on ventilator support MV: 12.3 L/min Temp  (24hrs), Avg:98.9 F (37.2 C), Min:98.2 F (36.8 C), Max:99.8 F (37.7 C)  Propofol: 13.1 ml/hr  Labs: Na low K WNL BUN elevated Phosphorus and Magnesium elevated   Height: Ht Readings from Last 1 Encounters:  September 09, 2013 5\' 8"  (1.727 m)    Weight: Wt Readings from Last 1 Encounters:  11/21/13 244 lb 4.3 oz (110.8 kg)    Ideal Body Weight: 140 lbs  % Ideal Body Weight: 174%  Wt Readings from Last 10 Encounters:  11/21/13 244 lb 4.3 oz (110.8 kg)  11/15/13 238 lb (107.956 kg)  03/30/13 240 lb 14.4 oz (109.272 kg)  09/06/12 226 lb (102.513 kg)   BMI:  Body mass index is 37.15 kg/(m^2).  Estimated Nutritional Needs: Kcal: 2234 Protein: 125-135 g Fluid: Per MD  Skin: Intact  Diet Order: NPO  EDUCATION NEEDS: -Education needs addressed   Intake/Output Summary (Last 24 hours) at 11/21/13 0923 Last data filed at 11/21/13 0800  Gross per 24 hour  Intake 1024.37 ml  Output     18 ml  Net 1006.37 ml    Last BM: prior to admission   Labs:   Recent Labs Lab 11/18/13 0650 11/18/13 1845 11/19/13 0230 11/20/13 0334 11/21/13 0450  NA 132* 133* 133* 132* 135*  K 3.6* 3.7 3.8 3.5* 4.3  CL 94* 96 99 95* 97  CO2 22 23 21  13* 18*  BUN 25* 28* 27* 28* 51*  CREATININE 1.23* 1.25* 1.04 1.73* 3.58*  CALCIUM 8.7 9.1 8.8 9.1 8.6  MG  --   --  2.0  --  2.7*  PHOS  --  3.2  --   --  4.8*  GLUCOSE 134* 140* 143* 194* 133*    CBG (last 3)   Recent Labs  11/20/13 2359 11/21/13 0403 11/21/13 0811  GLUCAP 149* 124* 119*    Scheduled Meds: . antiseptic oral rinse  7 mL Mouth Rinse q12n4p  . antiseptic oral rinse  7 mL Mouth Rinse QID  . azithromycin  250 mg Intravenous Q24H  . cefTRIAXone (ROCEPHIN)  IV  1 g Intravenous Q24H  . chlorhexidine  15 mL Mouth Rinse BID  . clonazePAM  1 mg Oral BID  . heparin  5,000 Units Subcutaneous 3 times per day  . insulin aspart  1-3 Units Subcutaneous 6 times per day  . loratadine  10 mg Oral Daily  . methylPREDNISolone  (SOLU-MEDROL) injection  125 mg Intravenous Q6H  . pantoprazole (PROTONIX) IV  40 mg Intravenous Daily  . polyethylene glycol  17 g Oral Daily  . vancomycin  2,500 mg Intravenous Once    Continuous Infusions: . fentaNYL infusion INTRAVENOUS 50 mcg/hr (11/21/13 0759)  . phenylephrine (NEO-SYNEPHRINE) Adult infusion 25 mcg/min (11/21/13 0630)  . propofol 20.031 mcg/kg/min (11/21/13 0604)  .  sodium bicarbonate 150 mEq in sterile water 1000 mL infusion 50 mL/hr at 11/21/13 [id number]    Past Medical History  Diagnosis Date  . Acid reflux disease   . Hypertension   . Arthritis     "joints"    Past Surgical History  Procedure Laterality Date  . Cesarean section  1997    Emmaline KluverHaley Koree Staheli RD, LDN

## 2013-11-21 NOTE — Procedures (Signed)
Central Venous Catheter Insertion Procedure Note Baldemar Lenisonya Theall [id number] 03/28/1975  Procedure: Insertion of Central Venous Catheter Indications: Assessment of intravascular volume and Drug and/or fluid administration  Procedure Details Consent: Risks of procedure as well as the alternatives and risks of each were explained to the (patient/caregiver).  Consent for procedure obtained. Time Out: Verified patient identification, verified procedure, site/side was marked, verified correct patient position, special equipment/implants available, medications/allergies/relevent history reviewed, required imaging and test results available.  Performed  Maximum sterile technique was used including antiseptics, cap, gloves, gown, hand hygiene, mask and sheet. Skin prep: Chlorhexidine; local anesthetic administered A antimicrobial bonded/coated triple lumen catheter was placed in the left internal jugular vein using the Seldinger technique.  Evaluation Blood flow good Complications: No apparent complications Patient did tolerate procedure well. Chest X-ray ordered to verify placement.  CXR: good position  Procedure performed by NP Babcock under my direct supervision  ALVA,RAKESH V. 11/21/2013, 3:12 PM

## 2013-11-21 NOTE — Progress Notes (Signed)
   Cultures called from micro   - GPC in pairs  X both bottles aerobic and anaerobic    Recent Labs Lab 11/18/13 0650 11/18/13 1845 11/19/13 0230 11/20/13 0334 11/21/13 0450  CREATININE 1.23* 1.25* 1.04 1.73* 3.58*   Estimated Creatinine Clearance: 28.1 ml/min (by C-G formula based on Cr of 3.58).  Estimated body mass index is 37.15 kg/(m^2) as calculated from the following:   Height as of this encounter: 5\' 8"  (1.727 m).   Weight as of this encounter: 244 lb 4.3 oz (110.8 kg).   No results found for this basename: PROCALCITON,  in the last 168 hours   Recent Labs Lab 11/20/13 0820 11/20/13 1352 11/20/13 1846  LATICACIDVEN 5.3* 4.4* 2.8*     A GPC sepsios  P One dose vanc  Dr. Kalman ShanMurali Jyquan Kenley, M.D., [facility] Pulmonary and Critical Care Medicine Staff Physician [facility] [place] [facility] Pager: [phone], If no answer or between  15:00h - 7:00h: call [phone]  11/21/2013 6:43 AM

## 2013-11-21 NOTE — Progress Notes (Addendum)
PULMONARY  / CRITICAL CARE MEDICINE CONSULTATION   Name: Courtney Cortez MRN: [id number] DOB: 23-Sep-1975    ADMISSION DATE:  11/24/2013 CONSULTATION DATE: 11/20/2013  REQUESTING CLINICIAN: Dr. Daryll Drown PRIMARY SERVICE: FM  CHIEF COMPLAINT:  SOB/CP  BRIEF PATIENT DESCRIPTION: 38 F with arthritis who presented to [facility] on 10/22 with CP and SOB thought to be 2/2 pericarditis. Developed BL pleural effusions & Markedly tachypnic and tachycardic on 10/25 and PCCM asked to evaluate.   SIGNIFICANT EVENTS     STUDIES:  10/21 PE protocol CT >> No PE but bilateral "atelectasis" 10/22 PE Protocol CT >> No PE but worsened bilateral basilar disease 10/22 Echo >> Trivial pericardial effusion, No evidence of tamponade 10/23 HIV negative 10/24 Echo >> Trivial pericardial effusion, No evidence of tamponade 10/25 >> ABG 7.315/32.8/59.4 10/25 ESR 130  CK 492 --> 200  LINES / TUBES: LIJ 10/25 >> ETT 10/25 >>  CULTURES: Blood Culture x 2 10/25 >> GPC pairs >> Urine Culture 10/25 Sputum Culture 10/25 Resp Viral Panel 10/25  ANTIBIOTICS: CTX 10/25 - Azithro 10/25-   SUBJECTIVE: Afebrile Intubated, sedated Off pressors Anuric  VITAL SIGNS: Temp:  [98.2 F (36.8 C)-99.5 F (37.5 C)] 98.4 F (36.9 C) (10/26 1141) Pulse Rate:  [106-134] 106 (10/26 1301) Resp:  [18-32] 24 (10/26 1301) BP: (77-125)/(36-80) 101/68 mmHg (10/26 1301) SpO2:  [97 %-100 %] 100 % (10/26 1301) FiO2 (%):  [40 %-100 %] 40 % (10/26 1301) Weight:  [110.8 kg (244 lb 4.3 oz)] 110.8 kg (244 lb 4.3 oz) (10/26 0212) HEMODYNAMICS: CVP:  [11 mmHg-21 mmHg] 16 mmHg VENTILATOR SETTINGS: Vent Mode:  [-] PRVC FiO2 (%):  [40 %-100 %] 40 % Set Rate:  [24 bmp] 24 bmp Vt Set:  [510 mL] 510 mL PEEP:  [5 cmH20] 5 cmH20 Plateau Pressure:  [24 cmH20-34 cmH20] 24 cmH20 INTAKE / OUTPUT: Intake/Output     10/25 0701 - 10/26 0700 10/26 0701 - 10/27 0700   I.V. (mL/kg) 676.3 (6.1) 258.9 (2.3)   NG/GT 30    IV Piggyback 300 175   Total Intake(mL/kg) 1006.3 (9.1) 433.9 (3.9)   Urine (mL/kg/hr) 16 (0) 24 (0)   Total Output 16 24   Net +990.3 +409.9        Urine Occurrence 1 x      PHYSICAL EXAMINATION: General:  Obese F intubated, sedated Neuro:  RASS -3 HEENT:  Sclera anicteric, conjunctiva pink, MMM, OP clear  Neck: Trachea supple and midline, (-) LAN or JVD  Cardiovascular:  Tachy, RR, NS1/S2, (-) MRG Lungs:  CTAB Abdomen:  S/NT/ND/(+)BS Musculoskeletal:  (-) C/C, trace pitting edema bilaterally Skin:  Intact,purpuric  rash over BLEs  LABS:  CBC  Recent Labs Lab 11/18/13 0650 11/20/13 0617 11/21/13 0450  WBC 6.2 6.3 12.2*  HGB 10.0* 10.0* 8.3*  HCT 29.8* 29.5* 24.2*  PLT 288 307 191   Coag's No results found for this basename: APTT, INR,  in the last 168 hours BMET  Recent Labs Lab 11/19/13 0230 11/20/13 0334 11/21/13 0450  NA 133* 132* 135*  K 3.8 3.5* 4.3  CL 99 95* 97  CO2 21 13* 18*  BUN 27* 28* 51*  CREATININE 1.04 1.73* 3.58*  GLUCOSE 143* 194* 133*   Electrolytes  Recent Labs Lab 11/18/13 1845 11/19/13 0230 11/20/13 0334 11/21/13 0450  CALCIUM 9.1 8.8 9.1 8.6  MG  --  2.0  --  2.7*  PHOS 3.2  --   --  4.8*   Sepsis Markers  Recent Labs  Lab 11/20/13 0820 11/20/13 1352 11/20/13 1846  LATICACIDVEN 5.3* 4.4* 2.8*   ABG  Recent Labs Lab 11/20/13 0506 11/20/13 1022 11/20/13 1618  PHART 7.315* 7.317* 7.257*  PCO2ART 32.8* 33.9* 42.2  PO2ART 59.4* 102.0* 193.0*   Liver Enzymes  Recent Labs Lab 11/16/2013 1843  AST 16  ALT 10  ALKPHOS 53  BILITOT 0.5  ALBUMIN 2.9*   Cardiac Enzymes  Recent Labs Lab 11/15/13 2351 11/12/2013 1022  11/19/13 1641 11/19/13 2157 11/20/13 0820  TROPONINI  --  <0.30  < > <0.30 <0.30 <0.30  PROBNP 63.1 577.2*  --   --   --  6391.0*  < > = values in this interval not displayed. Glucose  Recent Labs Lab 11/20/13 1545 11/20/13 1930 11/20/13 2359 11/21/13 0403 11/21/13 0811 11/21/13 1140  GLUCAP 137* 139* 149*  124* 119* 128*    Imaging Dg Chest 2 View  11/19/2013   CLINICAL DATA:  Shortness of breath at rest for 5 days. Hypertension.  EXAM: CHEST  2 VIEW  COMPARISON:  CT and chest x-ray 11/03/2013  FINDINGS: There are enlarging bilateral pleural effusions, now moderate. Bibasilar atelectasis or infiltrates. Cardiomegaly with vascular congestion. No overt edema.  No acute bony abnormality.  IMPRESSION: Enlarging moderate bilateral pleural effusions. Bibasilar atelectasis or infiltrates.  Cardiomegaly, vascular congestion.   Electronically Signed   By: Rolm Baptise M.D.   On: 11/19/2013 23:56   Portable Chest Xray  11/20/2013   CLINICAL DATA:  Endotracheal tube placement  EXAM: PORTABLE CHEST - 1 VIEW  COMPARISON:  Chest radiograph 11/20/2013  FINDINGS: Endotracheal tube has been placed and is in satisfactory position. The distal tip is 4 cm above the carina and well below the clavicular heads.  Left IJ central venous catheter projects over the upper right atrium in appears unchanged, and is partially obscured by cardiac leads projection of the chest.  Cardiomegaly is stable. Lung volumes remain low. There are extensive bibasilar opacities and moderate bilateral pleural effusions.  Negative for pneumothorax.  Gaseous distention of the stomach noted.  IMPRESSION: 1. Satisfactory position of endotracheal tube. 2. No change in left IJ central venous catheter, with a tip projecting over the right atrium. 3. Radiographic appearance of the chest is otherwise unchanged.   Electronically Signed   By: Curlene Dolphin M.D.   On: 11/20/2013 15:07   Dg Chest Port 1 View  11/20/2013   CLINICAL DATA:  38 year old female with acute respiratory failure. Evaluate central line placement.  EXAM: PORTABLE CHEST - 1 VIEW  COMPARISON:  Chest x-ray 11/20/2013.  FINDINGS: There is a left-sided internal jugular central venous catheter with tip terminating in the superior aspect of the right atrium. Lung volumes are low, and there  continues to be extensive areas of atelectasis and/or consolidation throughout the lung bases bilaterally, with superimposed moderate to large bilateral pleural effusions. No pneumothorax. Moderate enlargement of the cardiopericardial silhouette is again noted.  IMPRESSION: 1. Tip of left internal jugular central venous catheter is in the superior aspect of the right atrium. 2. No pneumothorax. 3. Radiographic appearance of the chest is otherwise unchanged, as above.   Electronically Signed   By: Vinnie Langton M.D.   On: 11/20/2013 13:51   Dg Chest Port 1 View  11/20/2013   CLINICAL DATA:  38 year old female with acute respiratory failure and labored breathing.  EXAM: PORTABLE CHEST - 1 VIEW  COMPARISON:  Chest x-ray 11/19/2013.  FINDINGS: Lung volumes are low. Significantly worsening aeration throughout the mid to lower  lungs bilaterally, compatible with areas of atelectasis and/or consolidation, with superimposed moderate large bilateral pleural effusions which appear to be increasing. Moderate enlargement of the cardiopericardial silhouette also appears to be worsening. Upper mediastinal contours are distorted by patient positioning.  IMPRESSION: 1. Significantly worsening aeration throughout the lung bases bilaterally, compatible with increasing areas of atelectasis and/or consolidation, with increasing moderate to large bilateral pleural effusions. 2. Increasing enlargement of the cardiopericardial silhouette. This could reflect worsening cardiomegaly, or could indicate the presence of a developing pericardial effusion. Clinical correlation is recommended.   Electronically Signed   By: Vinnie Langton M.D.   On: 11/20/2013 12:10   Dg Abd Portable 1v  11/20/2013   CLINICAL DATA:  NGT placement.  EXAM: PORTABLE ABDOMEN - 1 VIEW  COMPARISON:  CT abdomen and pelvis 11/25/2013.  FINDINGS: NG tube is in place with the tip in good position and the side-port in the stomach. Gaseous distention of the stomach  is noted.  IMPRESSION: NG tube is in good position.  Gaseous distention of the stomach.   Electronically Signed   By: Inge Rise M.D.   On: 11/20/2013 18:25    EKG: Sinus Tach ,diffuse ST elevation CXR: 10/25 Bl effusions, left appears loculated  ASSESSMENT / PLAN:  PULMONARY A: Acute hypoxic respiratory failure: DDx included  CAP, autoimmune pneumonitis/pleuritis. ? If there is an autoimmune process underlying all of her issues. Doubt aspiration P:   Intubated, vent bundle SBTs as/ when able    CARDIOVASCULAR A: Pleuritic Chest Pain 2/2 presumed pericarditis: nml Tp Septic shock P:  Off pressors   RENAL A: AKI: oliguric - ?ATN from contrast/ NSAIDs, hypotension ? presence of unifying autoimmune process, bland urinary sediment, low FENa AG Metabolic acidosis with respiratory compensation: Hypokalemia: Pos RA factor, ESR high P:    renal consult Autoimmune panel -await ANA, ANCA Bicarb gtt decrease steroids to stress dose while awaiting data   GASTROINTESTINAL A: GERD  P:   Cont PPI  HEMATOLOGIC A: Anemia: chronic disease, high ferritin P:     INFECTIOUS A: CAP: ?pneumococcus P:   Empiric CTX, azithro Pan-Culture -await ID of GPC in blood  ENDOCRINE A: Hyperglycemia:  P:   SSI  A1c  NEUROLOGIC A: PAD - fent gtt, RASS goal -1 Ct propofol as  BP permits  TODAY'S SUMMARY: Appears to be pneumococcal sepsis with AKI , unclear if  Underlying autoimmune pleuro-pericarditis with vasculitis   I have personally obtained a history, examined the patient, evaluated laboratory and imaging results, formulated the assessment and plan and placed orders.  CRITICAL CARE: The patient is critically ill with multiple organ systems failure and requires high complexity decision making for assessment and support, frequent evaluation and titration of therapies, application of advanced monitoring technologies and extensive interpretation of multiple databases.  Critical Care Time devoted to patient care services described in this note is 35 minutes.   Kara Mead MD. [place] Flood. [place] [facility] Pager [phone] If no response call [phone]    11/21/2013, 1:43 PM

## 2013-11-21 NOTE — Plan of Care (Signed)
Problem: Consults Goal: Nutrition Consult-if indicated Outcome: Completed/Met Date Met:  11/21/13 Nutrition consult obtained and tube feedings initiated.

## 2013-11-21 NOTE — Progress Notes (Signed)
Subjective: Interval History: has no complaint, sedated on vent but moves appropriately.  Objective: Vital signs in last 24 hours: Temp:  [98.2 F (36.8 C)-99.8 F (37.7 C)] 99.5 F (37.5 C) (10/26 0405) Pulse Rate:  [28-142] 115 (10/26 0700) Resp:  [16-35] 23 (10/26 0700) BP: (77-139)/(36-92) 121/72 mmHg (10/26 0700) SpO2:  [89 %-100 %] 97 % (10/26 0700) FiO2 (%):  [40 %-100 %] 40 % (10/26 0600) Weight:  [110.8 kg (244 lb 4.3 oz)] 110.8 kg (244 lb 4.3 oz) (10/26 0212) Weight change: 2.8 kg (6 lb 2.8 oz)  Intake/Output from previous day: 10/25 0701 - 10/26 0700 In: 1006.3 [I.V.:676.3; NG/GT:30; IV Piggyback:300] Out: 16 [Urine:16] Intake/Output this shift:    General appearance: sedated, on vent, moves to stim Resp: rales bibasilar and rhonchi bibasilar Cardio: S1, S2 normal and systolic murmur: holosystolic 2/6, blowing at apex GI: pos bs, but decreased, mild distension Extremities: edema 1+  Lab Results:  Recent Labs  11/20/13 0617 11/21/13 0450  WBC 6.3 12.2*  HGB 10.0* 8.3*  HCT 29.5* 24.2*  PLT 307 191   BMET:  Recent Labs  11/20/13 0334 11/21/13 0450  NA 132* 135*  K 3.5* 4.3  CL 95* 97  CO2 13* 18*  GLUCOSE 194* 133*  BUN 28* 51*  CREATININE 1.73* 3.58*  CALCIUM 9.1 8.6   No results found for this basename: PTH,  in the last 72 hours Iron Studies:  Recent Labs  11/20/13 0820  IRON <10*  TIBC Not calculated due to Iron <10.  FERRITIN 2168*    Studies/Results: Dg Chest 2 View  11/19/2013   CLINICAL DATA:  Shortness of breath at rest for 5 days. Hypertension.  EXAM: CHEST  2 VIEW  COMPARISON:  CT and chest x-ray   FINDINGS: There are enlarging bilateral pleural effusions, now moderate. Bibasilar atelectasis or infiltrates. Cardiomegaly with vascular congestion. No overt edema.  No acute bony abnormality.  IMPRESSION: Enlarging moderate bilateral pleural effusions. Bibasilar atelectasis or infiltrates.  Cardiomegaly, vascular  congestion.   Electronically Signed   By: Charlett NoseKevin  Dover M.D.   On: 11/19/2013 23:56   Portable Chest Xray  11/20/2013   CLINICAL DATA:  Endotracheal tube placement  EXAM: PORTABLE CHEST - 1 VIEW  COMPARISON:  Chest radiograph 11/20/2013  FINDINGS: Endotracheal tube has been placed and is in satisfactory position. The distal tip is 4 cm above the carina and well below the clavicular heads.  Left IJ central venous catheter projects over the upper right atrium in appears unchanged, and is partially obscured by cardiac leads projection of the chest.  Cardiomegaly is stable. Lung volumes remain low. There are extensive bibasilar opacities and moderate bilateral pleural effusions.  Negative for pneumothorax.  Gaseous distention of the stomach noted.  IMPRESSION: 1. Satisfactory position of endotracheal tube. 2. No change in left IJ central venous catheter, with a tip projecting over the right atrium. 3. Radiographic appearance of the chest is otherwise unchanged.   Electronically Signed   By: Britta MccreedySusan  Turner M.D.   On: 11/20/2013 15:07   Dg Chest Port 1 View  11/20/2013   CLINICAL DATA:  38 year old female with acute respiratory failure. Evaluate central line placement.  EXAM: PORTABLE CHEST - 1 VIEW  COMPARISON:  Chest x-ray 11/20/2013.  FINDINGS: There is a left-sided internal jugular central venous catheter with tip terminating in the superior aspect of the right atrium. Lung volumes are low, and there continues to be extensive areas of atelectasis and/or consolidation throughout the lung bases bilaterally,  with superimposed moderate to large bilateral pleural effusions. No pneumothorax. Moderate enlargement of the cardiopericardial silhouette is again noted.  IMPRESSION: 1. Tip of left internal jugular central venous catheter is in the superior aspect of the right atrium. 2. No pneumothorax. 3. Radiographic appearance of the chest is otherwise unchanged, as above.   Electronically Signed   By: Trudie Reedaniel  Entrikin  M.D.   On: 11/20/2013 13:51   Dg Chest Port 1 View  11/20/2013   CLINICAL DATA:  38 year old female with acute respiratory failure and labored breathing.  EXAM: PORTABLE CHEST - 1 VIEW  COMPARISON:  Chest x-ray 11/19/2013.  FINDINGS: Lung volumes are low. Significantly worsening aeration throughout the mid to lower lungs bilaterally, compatible with areas of atelectasis and/or consolidation, with superimposed moderate large bilateral pleural effusions which appear to be increasing. Moderate enlargement of the cardiopericardial silhouette also appears to be worsening. Upper mediastinal contours are distorted by patient positioning.  IMPRESSION: 1. Significantly worsening aeration throughout the lung bases bilaterally, compatible with increasing areas of atelectasis and/or consolidation, with increasing moderate to large bilateral pleural effusions. 2. Increasing enlargement of the cardiopericardial silhouette. This could reflect worsening cardiomegaly, or could indicate the presence of a developing pericardial effusion. Clinical correlation is recommended.   Electronically Signed   By: Trudie Reedaniel  Entrikin M.D.   On: 11/20/2013 12:10   Dg Abd Portable 1v  11/20/2013   CLINICAL DATA:  NGT placement.  EXAM: PORTABLE ABDOMEN - 1 VIEW  COMPARISON:  CT abdomen and pelvis 11/25/2013.  FINDINGS: NG tube is in place with the tip in good position and the side-port in the stomach. Gaseous distention of the stomach is noted.  IMPRESSION: NG tube is in good position.  Gaseous distention of the stomach.   Electronically Signed   By: Drusilla Kannerhomas  Dalessio M.D.   On: 11/20/2013 18:25    I have reviewed the patient's current medications.  Assessment/Plan: 1  AKI  Appears to be ATN.  FENA low c/w this .  Most likely sepsis with ATN and toxic component with NSAIDs, contrast.  Epidemiology susp for Collagen Vasc dz but not typical course.  Serologies pending.  Oliguric, but some urine this am.  Worsening solute, acid/base may need  RRT within 24 h.  Mild acidemia, K ok 2 Pneu 3 G Pos sepsis 4 VDRF 5 Anemia Fe defic and acute illness.  6Shock more stable P Change to isotonic bicarb IV, follow chem, AB support bp    LOS: 4 days   Arash Karstens L 11/21/2013,7:22 AM

## 2013-11-21 NOTE — Progress Notes (Signed)
CRITICAL VALUE ALERT  Critical value received:  Positive Blood Culture Gram Positive Cocci in Pairs Anaerobic & Aerobic bottles   Date of notification:  11/21/13  Time of notification:  0630  Critical value read back:Yes.    Nurse who received alert:  Dustin Flockhris Jaquaya Coyle RN  MD notified (1st page):  DR Dalbert Mayotteamaswammy   Time of first page:  [phone]  MD notified (2nd page):  Time of second page:  Responding MD: Dr Dalbert Mayotteamaswammy  Time MD responded:  [phone]

## 2013-11-22 ENCOUNTER — Encounter (HOSPITAL_COMMUNITY): Payer: Self-pay | Admitting: *Deleted

## 2013-11-22 ENCOUNTER — Inpatient Hospital Stay (HOSPITAL_COMMUNITY): Payer: PRIVATE HEALTH INSURANCE

## 2013-11-22 DIAGNOSIS — A419 Sepsis, unspecified organism: Secondary | ICD-10-CM

## 2013-11-22 DIAGNOSIS — R6521 Severe sepsis with septic shock: Secondary | ICD-10-CM

## 2013-11-22 LAB — COMPREHENSIVE METABOLIC PANEL
ALBUMIN: 1.7 g/dL — AB (ref 3.5–5.2)
ALK PHOS: 98 U/L (ref 39–117)
ALT: 116 U/L — ABNORMAL HIGH (ref 0–35)
ANION GAP: 21 — AB (ref 5–15)
AST: 295 U/L — AB (ref 0–37)
BUN: 87 mg/dL — AB (ref 6–23)
CO2: 20 mEq/L (ref 19–32)
Calcium: 8.2 mg/dL — ABNORMAL LOW (ref 8.4–10.5)
Chloride: 94 mEq/L — ABNORMAL LOW (ref 96–112)
Creatinine, Ser: 4.76 mg/dL — ABNORMAL HIGH (ref 0.50–1.10)
GFR calc Af Amer: 12 mL/min — ABNORMAL LOW (ref >90)
GFR calc non Af Amer: 11 mL/min — ABNORMAL LOW (ref >90)
Glucose, Bld: 180 mg/dL — ABNORMAL HIGH (ref 70–99)
POTASSIUM: 4.1 meq/L (ref 3.7–5.3)
Sodium: 135 mEq/L — ABNORMAL LOW (ref 137–147)
TOTAL PROTEIN: 7.9 g/dL (ref 6.0–8.3)
Total Bilirubin: 0.7 mg/dL (ref 0.3–1.2)

## 2013-11-22 LAB — GLUCOSE, CAPILLARY
GLUCOSE-CAPILLARY: 147 mg/dL — AB (ref 70–99)
GLUCOSE-CAPILLARY: 183 mg/dL — AB (ref 70–99)
GLUCOSE-CAPILLARY: 205 mg/dL — AB (ref 70–99)
Glucose-Capillary: 184 mg/dL — ABNORMAL HIGH (ref 70–99)
Glucose-Capillary: 192 mg/dL — ABNORMAL HIGH (ref 70–99)
Glucose-Capillary: 185 mg/dL — ABNORMAL HIGH (ref 70–99)

## 2013-11-22 LAB — RENAL FUNCTION PANEL
ANION GAP: 17 — AB (ref 5–15)
Albumin: 1.7 g/dL — ABNORMAL LOW (ref 3.5–5.2)
BUN: 92 mg/dL — ABNORMAL HIGH (ref 6–23)
CHLORIDE: 93 meq/L — AB (ref 96–112)
CO2: 23 meq/L (ref 19–32)
Calcium: 7.9 mg/dL — ABNORMAL LOW (ref 8.4–10.5)
Creatinine, Ser: 4.18 mg/dL — ABNORMAL HIGH (ref 0.50–1.10)
GFR calc non Af Amer: 13 mL/min — ABNORMAL LOW (ref >90)
GFR, EST AFRICAN AMERICAN: 15 mL/min — AB (ref >90)
Glucose, Bld: 202 mg/dL — ABNORMAL HIGH (ref 70–99)
POTASSIUM: 4.6 meq/L (ref 3.7–5.3)
Phosphorus: 6.6 mg/dL — ABNORMAL HIGH (ref 2.3–4.6)
SODIUM: 133 meq/L — AB (ref 137–147)

## 2013-11-22 LAB — POCT ACTIVATED CLOTTING TIME
ACTIVATED CLOTTING TIME: 146 s
Activated Clotting Time: 152 seconds
Activated Clotting Time: 152 seconds
Activated Clotting Time: 146 seconds
Activated Clotting Time: 168 seconds
Activated Clotting Time: 163 seconds
Activated Clotting Time: 168 seconds
Activated Clotting Time: 163 seconds

## 2013-11-22 LAB — BLOOD GAS, ARTERIAL
Acid-base deficit: 6.6 mmol/L — ABNORMAL HIGH (ref 0.0–2.0)
BICARBONATE: 18.6 meq/L — AB (ref 20.0–24.0)
Drawn by: 31288
FIO2: 0.3 %
MECHVT: 510 mL
O2 SAT: 93.4 %
PATIENT TEMPERATURE: 98.6
PEEP: 5 cmH2O
RATE: 24 resp/min
TCO2: 19.8 mmol/L (ref 0–100)
pCO2 arterial: 38.6 mmHg (ref 35.0–45.0)
pH, Arterial: 7.304 — ABNORMAL LOW (ref 7.350–7.450)
pO2, Arterial: 77.6 mmHg — ABNORMAL LOW (ref 80.0–100.0)

## 2013-11-22 LAB — URINE CULTURE
Colony Count: NO GROWTH
Culture: NO GROWTH

## 2013-11-22 LAB — POCT I-STAT 3, ART BLOOD GAS (G3+)
Acid-base deficit: 6 mmol/L — ABNORMAL HIGH (ref 0.0–2.0)
Bicarbonate: 20.1 mEq/L (ref 20.0–24.0)
O2 Saturation: 92 %
TCO2: 21 mmol/L (ref 0–100)
pCO2 arterial: 39.7 mmHg (ref 35.0–45.0)
pH, Arterial: 7.312 — ABNORMAL LOW (ref 7.350–7.450)
pO2, Arterial: 69 mmHg — ABNORMAL LOW (ref 80.0–100.0)

## 2013-11-22 LAB — ANTI-SCLERODERMA ANTIBODY: Scleroderma (Scl-70) (ENA) Antibody, IgG: <1

## 2013-11-22 LAB — MPO/PR-3 (ANCA) ANTIBODIES
Myeloperoxidase Abs: <1
Serine Protease 3: <1

## 2013-11-22 LAB — CBC
HCT: 22.3 % — ABNORMAL LOW (ref 36.0–46.0)
Hemoglobin: 7.9 g/dL — ABNORMAL LOW (ref 12.0–15.0)
MCH: 28.9 pg (ref 26.0–34.0)
MCHC: 35.4 g/dL (ref 30.0–36.0)
MCV: 81.7 fL (ref 78.0–100.0)
PLATELETS: 163 10*3/uL (ref 150–400)
RBC: 2.73 MIL/uL — ABNORMAL LOW (ref 3.87–5.11)
RDW: 14.7 % (ref 11.5–15.5)
WBC: 20.3 10*3/uL — ABNORMAL HIGH (ref 4.0–10.5)

## 2013-11-22 LAB — ANTI-SMITH ANTIBODY: ENA SM Ab Ser-aCnc: <1

## 2013-11-22 LAB — ANTI-SMOOTH MUSCLE ANTIBODY, IGG: F-Actin IgG: 8 U (ref <20)

## 2013-11-22 LAB — ANTI-DNA ANTIBODY, DOUBLE-STRANDED: ds DNA Ab: 1 IU/mL

## 2013-11-22 LAB — PHOSPHORUS: Phosphorus: 6 mg/dL — ABNORMAL HIGH (ref 2.3–4.6)

## 2013-11-22 MED ORDER — SODIUM CHLORIDE 0.9 % FOR CRRT
INTRAVENOUS_CENTRAL | Status: DC | PRN
  Filled 2013-11-22: qty 1000

## 2013-11-22 MED ORDER — PRISMASOL BGK 4/2.5 32-4-2.5 MEQ/L IV SOLN
INTRAVENOUS | Status: DC
  Administered 2013-11-22 – 2013-11-26 (×12): via INTRAVENOUS_CENTRAL
  Filled 2013-11-22 (×22): qty 5000

## 2013-11-22 MED ORDER — METOCLOPRAMIDE HCL 5 MG/ML IJ SOLN
5.0000 mg | Freq: Three times a day (TID) | INTRAMUSCULAR | Status: DC
Start: 2013-11-22 — End: 2013-11-24
  Administered 2013-11-22 – 2013-11-24 (×6): 5 mg via INTRAVENOUS
  Filled 2013-11-22 (×9): qty 1

## 2013-11-22 MED ORDER — SODIUM CHLORIDE 0.9 % IV SOLN
INTRAVENOUS | Status: DC | PRN
Start: 2013-11-22 — End: 2013-11-26
  Administered 2013-11-22: 20:00:00 via INTRAVENOUS

## 2013-11-22 MED ORDER — VITAL HIGH PROTEIN PO LIQD
1000.0000 mL | ORAL | Status: DC
  Administered 2013-11-22 – 2013-11-23 (×2): 1000 mL
  Administered 2013-11-24: 09:00:00
  Administered 2013-11-24: 1000 mL
  Administered 2013-11-24: 14:00:00
  Administered 2013-11-25: 1000 mL
  Filled 2013-11-22 (×9): qty 1000

## 2013-11-22 MED ORDER — CLONAZEPAM 0.5 MG PO TABS: 1.0000 mg | ORAL_TABLET | Freq: Every day | ORAL | Status: DC

## 2013-11-22 MED ORDER — HEPARIN SODIUM (PORCINE) 1000 UNIT/ML DIALYSIS
1000.0000 [IU] | INTRAMUSCULAR | Status: DC | PRN
  Filled 2013-11-22: qty 6
  Filled 2013-11-22: qty 3

## 2013-11-22 MED ORDER — INSULIN ASPART 100 UNIT/ML ~~LOC~~ SOLN
0.0000 [IU] | SUBCUTANEOUS | Status: DC
Start: 2013-11-22 — End: 2013-11-23
  Administered 2013-11-22 (×2): 3 [IU] via SUBCUTANEOUS
  Administered 2013-11-22: 5 [IU] via SUBCUTANEOUS
  Administered 2013-11-22 – 2013-11-23 (×3): 3 [IU] via SUBCUTANEOUS

## 2013-11-22 MED ORDER — PRISMASOL BGK 4/2.5 32-4-2.5 MEQ/L IV SOLN
INTRAVENOUS | Status: DC
  Administered 2013-11-22 – 2013-11-26 (×12): via INTRAVENOUS_CENTRAL
  Filled 2013-11-22 (×19): qty 5000

## 2013-11-22 MED ORDER — SODIUM CHLORIDE 0.9 % IJ SOLN
250.0000 [IU]/h | INTRAMUSCULAR | Status: DC
  Administered 2013-11-22: 250 [IU]/h via INTRAVENOUS_CENTRAL
  Administered 2013-11-22: 1050 [IU]/h via INTRAVENOUS_CENTRAL
  Administered 2013-11-23: 1200 [IU]/h via INTRAVENOUS_CENTRAL
  Administered 2013-11-23 (×2): 1750 [IU]/h via INTRAVENOUS_CENTRAL
  Administered 2013-11-23: 1700 [IU]/h via INTRAVENOUS_CENTRAL
  Administered 2013-11-24: 1100 [IU]/h via INTRAVENOUS_CENTRAL
  Filled 2013-11-22 (×8): qty 2

## 2013-11-22 MED ORDER — PRISMASOL BGK 4/2.5 32-4-2.5 MEQ/L IV SOLN
INTRAVENOUS | Status: DC
  Administered 2013-11-22 – 2013-11-26 (×29): via INTRAVENOUS_CENTRAL
  Filled 2013-11-22 (×50): qty 5000

## 2013-11-22 MED ORDER — HEPARIN BOLUS VIA INFUSION (CRRT)
1000.0000 [IU] | INTRAVENOUS | Status: DC | PRN
  Filled 2013-11-22: qty 1000

## 2013-11-22 MED ORDER — VITAL HIGH PROTEIN PO LIQD
1000.0000 mL | ORAL | Status: DC
  Filled 2013-11-22 (×2): qty 1000

## 2013-11-22 NOTE — Progress Notes (Signed)
PULMONARY  / CRITICAL CARE MEDICINE CONSULTATION   Name: Courtney Cortez MRN: [id number] DOB: 08-22-1975    ADMISSION DATE:  11/15/2013 CONSULTATION DATE: 11/20/2013  REQUESTING CLINICIAN: Dr. Daryll Drown PRIMARY SERVICE: FM  CHIEF COMPLAINT:  SOB/CP  BRIEF PATIENT DESCRIPTION: 38 F with arthritis who presented to [facility] on 10/22 with CP and SOB thought to be 2/2 pericarditis. Developed BL pleural effusions & Markedly tachypnic and tachycardic on 10/25 and PCCM asked to evaluate.  Blood cultures subsequently showed GPC  SIGNIFICANT EVENTS   10/27 CRRT  STUDIES:  10/21 PE protocol CT >> No PE but bilateral "atelectasis" 10/22 PE Protocol CT >> No PE but worsened bilateral basilar disease 10/22 Echo >> Trivial pericardial effusion, No evidence of tamponade 10/23 HIV negative 10/24 Echo >> Trivial pericardial effusion, No evidence of tamponade 10/25 >> ABG 7.315/32.8/59.4 10/25 ESR 130 10/26 Renal U/S >> Increased echogenicity, no hydronephrosis 10/27 CXR >> Worsening effusions CK 492 --> 200  LINES / TUBES: LIJ 10/25 >> ETT 10/25 >>  CULTURES: Blood Culture x 2 10/25 >> GPC pairs >> Urine Culture 10/25>> ng Sputum Culture 10/25>>GPC pairs >> Resp Viral Panel 10/25>> neg  ANTIBIOTICS: CTX 10/25 - Azithro 10/25-   SUBJECTIVE: Afebrile Intubated, RASS -1 on WUA on precedex gtt Anuric  VITAL SIGNS: Temp:  [98.1 F (36.7 C)-99.5 F (37.5 C)] 98.4 F (36.9 C) (10/27 0345) Pulse Rate:  [84-117] 86 (10/27 0700) Resp:  [20-32] 23 (10/27 0700) BP: (78-132)/(42-82) 118/81 mmHg (10/27 0700) SpO2:  [96 %-100 %] 100 % (10/27 0700) FiO2 (%):  [30 %-40 %] 30 % (10/27 0340) Weight:  [250 lb 14.1 oz (113.8 kg)] 250 lb 14.1 oz (113.8 kg) (10/27 0500) HEMODYNAMICS: CVP:  [13 mmHg-18 mmHg] 14 mmHg VENTILATOR SETTINGS: Vent Mode:  [-] PRVC FiO2 (%):  [30 %-40 %] 30 % Set Rate:  [20 bmp-24 bmp] 20 bmp Vt Set:  [510 mL] 510 mL PEEP:  [5 cmH20] 5 cmH20 Plateau Pressure:  [19 cmH20-34  cmH20] 19 cmH20 INTAKE / OUTPUT: Intake/Output     10/26 0701 - 10/27 0700 10/27 0701 - 10/28 0700   I.V. (mL/kg) 847.9 (7.5)    NG/GT 491.3    IV Piggyback 175    Total Intake(mL/kg) 1514.1 (13.3)    Urine (mL/kg/hr) 94 (0)    Total Output 94     Net +1420.1            PHYSICAL EXAMINATION: General:  Obese F intubated, sedated Neuro:  RASS -2 HEENT:  Sclera anicteric, conjunctiva pink, MMM, OP clear  Neck: Trachea supple and midline, (-) LAN or JVD  Cardiovascular:  Tachy, RR, NS1/S2, II/VI SEM Lungs: Diminished at bases. Abdomen:  S/NT/ND/(+)BS Musculoskeletal:  (-) C/C, 1+ pitting edema bilaterally Skin:  Intact, purpuric rash over BLEs  LABS:  CBC  Recent Labs Lab 11/20/13 0617 11/21/13 0450 11/22/13 0415  WBC 6.3 12.2* 20.3*  HGB 10.0* 8.3* 7.9*  HCT 29.5* 24.2* 22.3*  PLT 307 191 163   Coag's No results found for this basename: APTT, INR,  in the last 168 hours BMET  Recent Labs Lab 11/21/13 0450 11/21/13 1521 11/22/13 0415  NA 135* 133* 135*  K 4.3 4.1 4.1  CL 97 95* 94*  CO2 18* 19 20  BUN 51* 64* 87*  CREATININE 3.58* 4.18* 4.76*  GLUCOSE 133* 128* 180*   Electrolytes  Recent Labs Lab 11/19/13 0230  11/21/13 0450 11/21/13 1521 11/22/13 0415  CALCIUM 8.8  < > 8.6 8.3* 8.2*  MG  2.0  --  2.7*  --   --   PHOS  --   --  4.8* 5.3* 6.0*  < > = values in this interval not displayed. Sepsis Markers  Recent Labs Lab 11/20/13 0820 11/20/13 1352 11/20/13 1846  LATICACIDVEN 5.3* 4.4* 2.8*   ABG  Recent Labs Lab 11/20/13 1022 11/20/13 1618 11/22/13 0220  PHART 7.317* 7.257* 7.304*  PCO2ART 33.9* 42.2 38.6  PO2ART 102.0* 193.0* 77.6*   Liver Enzymes  Recent Labs Lab 11/05/2013 1843 11/21/13 1521 11/22/13 0415  AST 16  --  295*  ALT 10  --  116*  ALKPHOS 53  --  98  BILITOT 0.5  --  0.7  ALBUMIN 2.9* 1.8* 1.7*   Cardiac Enzymes  Recent Labs Lab 11/15/13 2351 11/16/2013 1022  11/19/13 1641 11/19/13 2157 11/20/13 0820   TROPONINI  --  <0.30  < > <0.30 <0.30 <0.30  PROBNP 63.1 577.2*  --   --   --  6391.0*  < > = values in this interval not displayed. Glucose  Recent Labs Lab 11/21/13 0811 11/21/13 1140 11/21/13 1608 11/21/13 2011 11/22/13 0009 11/22/13 0348  GLUCAP 119* 128* 139* 145* 147* 183*    Imaging US Renal  11/21/2013   CLINICAL DATA:  Acute renal failure. Hypertension. Patient on ventilator.  EXAM: RENAL/URINARY TRACT ULTRASOUND COMPLETE  COMPARISON:  CT 11/16/2013  FINDINGS: Right Kidney:  Length: 14.3 cm.  No hydronephrosis.  Mildly increased echogenicity.  Left Kidney:  Length: 13.2 cm. No hydronephrosis. Mildly increased echogenicity.  Bladder:  Collapsed around a Foley catheter.  Bilateral pleural effusions incidentally noted.  IMPRESSION: 1.  No hydronephrosis. 2. Increased renal echogenicity, suggesting medical renal disease. 3. Bilateral pleural effusions.   Electronically Signed   By: Abigail Miyamoto M.D.   On: 11/21/2013 17:43   Dg Chest Port 1 View  11/22/2013   CLINICAL DATA:  Hypoxia/shortness of breath  EXAM: PORTABLE CHEST - 1 VIEW  COMPARISON:  November 20, 2013.  FINDINGS: Endotracheal tube tip is 3.1 cm above the carina. Nasogastric tube tip and side port are below the diaphragm. Central catheter tip is at the cavoatrial junction. No apparent pneumothorax. There is cardiomegaly with bilateral effusions and mild interstitial edema. There is consolidation in both lower lobes, stable. No adenopathy. No bone lesions.  IMPRESSION: Tube and catheter positions as described without pneumothorax. Findings consistent with a degree of congestive heart failure. Consolidation in both lower lobe regions may well represent alveolar edema, although superimposed pneumonia cannot be excluded. Both congestive heart failure and pneumonia may exist concurrently.   Electronically Signed   By: Lowella Grip M.D.   On: 11/22/2013 07:22   Portable Chest Xray  11/20/2013   CLINICAL DATA:  Endotracheal  tube placement  EXAM: PORTABLE CHEST - 1 VIEW  COMPARISON:  Chest radiograph 11/20/2013  FINDINGS: Endotracheal tube has been placed and is in satisfactory position. The distal tip is 4 cm above the carina and well below the clavicular heads.  Left IJ central venous catheter projects over the upper right atrium in appears unchanged, and is partially obscured by cardiac leads projection of the chest.  Cardiomegaly is stable. Lung volumes remain low. There are extensive bibasilar opacities and moderate bilateral pleural effusions.  Negative for pneumothorax.  Gaseous distention of the stomach noted.  IMPRESSION: 1. Satisfactory position of endotracheal tube. 2. No change in left IJ central venous catheter, with a tip projecting over the right atrium. 3. Radiographic appearance of the chest is  otherwise unchanged.   Electronically Signed   By: Curlene Dolphin M.D.   On: 11/20/2013 15:07   Dg Chest Port 1 View  11/20/2013   CLINICAL DATA:  38 year old female with acute respiratory failure. Evaluate central line placement.  EXAM: PORTABLE CHEST - 1 VIEW  COMPARISON:  Chest x-ray 11/20/2013.  FINDINGS: There is a left-sided internal jugular central venous catheter with tip terminating in the superior aspect of the right atrium. Lung volumes are low, and there continues to be extensive areas of atelectasis and/or consolidation throughout the lung bases bilaterally, with superimposed moderate to large bilateral pleural effusions. No pneumothorax. Moderate enlargement of the cardiopericardial silhouette is again noted.  IMPRESSION: 1. Tip of left internal jugular central venous catheter is in the superior aspect of the right atrium. 2. No pneumothorax. 3. Radiographic appearance of the chest is otherwise unchanged, as above.   Electronically Signed   By: Vinnie Langton M.D.   On: 11/20/2013 13:51   Dg Chest Port 1 View  11/20/2013   CLINICAL DATA:  38 year old female with acute respiratory failure and labored  breathing.  EXAM: PORTABLE CHEST - 1 VIEW  COMPARISON:  Chest x-ray 11/19/2013.  FINDINGS: Lung volumes are low. Significantly worsening aeration throughout the mid to lower lungs bilaterally, compatible with areas of atelectasis and/or consolidation, with superimposed moderate large bilateral pleural effusions which appear to be increasing. Moderate enlargement of the cardiopericardial silhouette also appears to be worsening. Upper mediastinal contours are distorted by patient positioning.  IMPRESSION: 1. Significantly worsening aeration throughout the lung bases bilaterally, compatible with increasing areas of atelectasis and/or consolidation, with increasing moderate to large bilateral pleural effusions. 2. Increasing enlargement of the cardiopericardial silhouette. This could reflect worsening cardiomegaly, or could indicate the presence of a developing pericardial effusion. Clinical correlation is recommended.   Electronically Signed   By: Vinnie Langton M.D.   On: 11/20/2013 12:10   Dg Abd Portable 1v  11/20/2013   CLINICAL DATA:  NGT placement.  EXAM: PORTABLE ABDOMEN - 1 VIEW  COMPARISON:  CT abdomen and pelvis 11/08/2013.  FINDINGS: NG tube is in place with the tip in good position and the side-port in the stomach. Gaseous distention of the stomach is noted.  IMPRESSION: NG tube is in good position.  Gaseous distention of the stomach.   Electronically Signed   By: Inge Rise M.D.   On: 11/20/2013 18:25    EKG: Sinus Tach ,diffuse ST elevation, PR interval shortening CXR: 10/25 Bl effusions, left appears loculated  ASSESSMENT / PLAN:  PULMONARY A: Acute hypoxic respiratory failure: Favor pneumococcal sepsis,DDx included autoimmune pneumonitis/pleuritis. ? If there is an autoimmune process underlying all of her issues. Doubt aspiration P:   Intubated, vent bundle SBTs as/ when able   CARDIOVASCULAR A: Pleuritic Chest Pain 2/2 presumed pericarditis: nml Tp Septic shock 2/2 gram  positive (likely pneumococcal) bacteremia P:  Continue neo gtt Stress-dose steroids  RENAL A: ARF: oliguric - ?ATN from contrast/ NSAIDs, hypotension ? presence of unifying autoimmune process, bland urinary sediment, low FENa AG Metabolic acidosis with respiratory compensation: Hypokalemia - resolved Pos RA factor, ANA, ESR high -favor SLE P:   Renal consult - Start CVVHD Await autoimmune panel - ANCA, complement, Anti-GBM, ASO, Anti-dsDNA Daily renal function panel  GASTROINTESTINAL A: GERD  P:   Cont PPI TFs @ 40cc/hr with rising residuals - decrease rate to 25 cc/hr  HEMATOLOGIC A: Anemia: chronic disease, high ferritin P:   Intermittent CBC  INFECTIOUS A: Probable pneumococcal  bacteremia P:   Blood C x2 >> GPC in pairs Respiratory C >> RVP >>  Strep pneumo urinary Ag >> POSITIVE Abx: ceftriaxone 10/25 >> stop date: 10/31 Abx: azithromycin 10/25 >> stop date: 10/29 Await ID of GPC in pairs in blood  ENDOCRINE A: Hyperglycemia:  P:   Increase SSI to moderate scale A1c - 5.9%  NEUROLOGIC A:  Sedation requirement P: PAD - fent and precedex gtt, RASS goal -1   Family updated 10/26, expected again today  Ryan B. 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 Serologies in progress.   The patient is critically ill with multiple organ systems failure and requires high complexity decision making for assessment and support, frequent evaluation and titration of therapies, application of advanced monitoring technologies and extensive interpretation of multiple databases. Critical Care Time devoted to patient care services described in this note is 35 minutes.   Rigoberto Noel MD  11/22/2013 7:28 AM

## 2013-11-22 NOTE — Progress Notes (Signed)
Subjective: Interval History: has no complaint, sedated on vent.  Objective: Vital signs in last 24 hours: Temp:  [98.1 F (36.7 C)-99.5 F (37.5 C)] 98.4 F (36.9 C) (10/27 0345) Pulse Rate:  [84-117] 86 (10/27 0700) Resp:  [20-32] 23 (10/27 0700) BP: (78-132)/(42-82) 118/81 mmHg (10/27 0700) SpO2:  [96 %-100 %] 100 % (10/27 0700) FiO2 (%):  [30 %-40 %] 30 % (10/27 0340) Weight:  [113.8 kg (250 lb 14.1 oz)] 113.8 kg (250 lb 14.1 oz) (10/27 0500) Weight change: 3 kg (6 lb 9.8 oz)  Intake/Output from previous day: 10/26 0701 - 10/27 0700 In: 1514.1 [I.V.:847.9; NG/GT:491.3; IV Piggyback:175] Out: 94 [Urine:94] Intake/Output this shift:    General appearance: moderately obese and sedated on vent, moves extrem but not alert Resp: rales bibasilar Cardio: S1, S2 normal and systolic murmur: holosystolic 2/6, blowing throughout the precordium GI: soft, few bs, nontender,  obese striae Extremities: edema 1+  Lab Results:  Recent Labs  11/21/13 0450 11/22/13 0415  WBC 12.2* 20.3*  HGB 8.3* 7.9*  HCT 24.2* 22.3*  PLT 191 163   BMET:  Recent Labs  11/21/13 1521 11/22/13 0415  NA 133* 135*  K 4.1 4.1  CL 95* 94*  CO2 19 20  GLUCOSE 128* 180*  BUN 64* 87*  CREATININE 4.18* 4.76*  CALCIUM 8.3* 8.2*   No results found for this basename: PTH,  in the last 72 hours Iron Studies:  Recent Labs  11/20/13 0820  IRON <10*  TIBC Not calculated due to Iron <10.  FERRITIN 2168*    Studies/Results: [place] Renal  11/21/2013   CLINICAL DATA:  Acute renal failure. Hypertension. Patient on ventilator.  EXAM: RENAL/URINARY TRACT ULTRASOUND COMPLETE  COMPARISON:  CT 10/31/2013  FINDINGS: Right Kidney:  Length: 14.3 cm.  No hydronephrosis.  Mildly increased echogenicity.  Left Kidney:  Length: 13.2 cm. No hydronephrosis. Mildly increased echogenicity.  Bladder:  Collapsed around a Foley catheter.  Bilateral pleural effusions incidentally noted.  IMPRESSION: 1.  No hydronephrosis. 2.  Increased renal echogenicity, suggesting medical renal disease. 3. Bilateral pleural effusions.   Electronically Signed   By: Jeronimo GreavesKyle  Talbot M.D.   On: 11/21/2013 17:43   Portable Chest Xray  11/20/2013   CLINICAL DATA:  Endotracheal tube placement  EXAM: PORTABLE CHEST - 1 VIEW  COMPARISON:  Chest radiograph 11/20/2013  FINDINGS: Endotracheal tube has been placed and is in satisfactory position. The distal tip is 4 cm above the carina and well below the clavicular heads.  Left IJ central venous catheter projects over the upper right atrium in appears unchanged, and is partially obscured by cardiac leads projection of the chest.  Cardiomegaly is stable. Lung volumes remain low. There are extensive bibasilar opacities and moderate bilateral pleural effusions.  Negative for pneumothorax.  Gaseous distention of the stomach noted.  IMPRESSION: 1. Satisfactory position of endotracheal tube. 2. No change in left IJ central venous catheter, with a tip projecting over the right atrium. 3. Radiographic appearance of the chest is otherwise unchanged.   Electronically Signed   By: Britta MccreedySusan  Turner M.D.   On: 11/20/2013 15:07   Dg Chest Port 1 View  11/20/2013   CLINICAL DATA:  38 year old female with acute respiratory failure. Evaluate central line placement.  EXAM: PORTABLE CHEST - 1 VIEW  COMPARISON:  Chest x-ray 11/20/2013.  FINDINGS: There is a left-sided internal jugular central venous catheter with tip terminating in the superior aspect of the right atrium. Lung volumes are low, and there continues to  be extensive areas of atelectasis and/or consolidation throughout the lung bases bilaterally, with superimposed moderate to large bilateral pleural effusions. No pneumothorax. Moderate enlargement of the cardiopericardial silhouette is again noted.  IMPRESSION: 1. Tip of left internal jugular central venous catheter is in the superior aspect of the right atrium. 2. No pneumothorax. 3. Radiographic appearance of the chest  is otherwise unchanged, as above.   Electronically Signed   By: Trudie Reedaniel  Entrikin M.D.   On: 11/20/2013 13:51   Dg Chest Port 1 View  11/20/2013   CLINICAL DATA:  38 year old female with acute respiratory failure and labored breathing.  EXAM: PORTABLE CHEST - 1 VIEW  COMPARISON:  Chest x-ray 11/19/2013.  FINDINGS: Lung volumes are low. Significantly worsening aeration throughout the mid to lower lungs bilaterally, compatible with areas of atelectasis and/or consolidation, with superimposed moderate large bilateral pleural effusions which appear to be increasing. Moderate enlargement of the cardiopericardial silhouette also appears to be worsening. Upper mediastinal contours are distorted by patient positioning.  IMPRESSION: 1. Significantly worsening aeration throughout the lung bases bilaterally, compatible with increasing areas of atelectasis and/or consolidation, with increasing moderate to large bilateral pleural effusions. 2. Increasing enlargement of the cardiopericardial silhouette. This could reflect worsening cardiomegaly, or could indicate the presence of a developing pericardial effusion. Clinical correlation is recommended.   Electronically Signed   By: Trudie Reedaniel  Entrikin M.D.   On: 11/20/2013 12:10   Dg Abd Portable 1v  11/20/2013   CLINICAL DATA:  NGT placement.  EXAM: PORTABLE ABDOMEN - 1 VIEW  COMPARISON:  CT abdomen and pelvis Jan 12, 2014.  FINDINGS: NG tube is in place with the tip in good position and the side-port in the stomach. Gaseous distention of the stomach is noted.  IMPRESSION: NG tube is in good position.  Gaseous distention of the stomach.   Electronically Signed   By: Drusilla Kannerhomas  Dalessio M.D.   On: 11/20/2013 18:25    I have reviewed the patient's current medications.  Assessment/Plan: 1 AKI sepsis,withSIRS but also concern of underlying autoimmune disease.  Need to tx AKI, await serologies., tx sepsis , allow resp status to improve, and pursue w/u as indic.  Need to start CRRT  as worsening vol, solute, and acidemic 2 Anemia  follow  3 VDRF  Per CCM 4 Gram pos sepsis on AB 5 GERD 6 Nutrition high residuals.  Lower vol P CRRT, AB, Vent, low vol TF  LOS: 5 days   Harpreet Signore L 11/22/2013,7:18 AM

## 2013-11-22 NOTE — Procedures (Signed)
Central Venous hemodialysis Catheter Insertion Procedure Note Courtney Cortez [id number] 05/10/1975  Procedure: Insertion of Central Venous hemodialysis Catheter Indications: hemodialysis  Procedure Details Consent: Risks of procedure as well as the alternatives and risks of each were explained to the (patient/caregiver).  Consent for procedure obtained. Time Out: Verified patient identification, verified procedure, site/side was marked, verified correct patient position, special equipment/implants available, medications/allergies/relevent history reviewed, required imaging and test results available.  Performed Real time US was used to ID and cannulate the vessel  Maximum sterile technique was used including antiseptics, cap, gloves, gown, hand hygiene, mask and sheet. Skin prep: Chlorhexidine; local anesthetic administered A antimicrobial bonded/coated triple lumen catheter was placed in the right internal jugular vein using the Seldinger technique.  Evaluation Blood flow good Complications: No apparent complications Patient did tolerate procedure well. Chest X-ray ordered to verify placement.  CXR: pending.  BABCOCK,PETE 11/22/2013, 11:23 AM  Supervised by me  Oretha MilchALVA,RAKESH V. MD

## 2013-11-23 LAB — COMPREHENSIVE METABOLIC PANEL
ALT: 145 U/L — ABNORMAL HIGH (ref 0–35)
AST: 300 U/L — ABNORMAL HIGH (ref 0–37)
Albumin: 1.6 g/dL — ABNORMAL LOW (ref 3.5–5.2)
Alkaline Phosphatase: 131 U/L — ABNORMAL HIGH (ref 39–117)
Anion gap: 16 — ABNORMAL HIGH (ref 5–15)
BUN: 65 mg/dL — ABNORMAL HIGH (ref 6–23)
CALCIUM: 7.7 mg/dL — AB (ref 8.4–10.5)
CHLORIDE: 96 meq/L (ref 96–112)
CO2: 23 meq/L (ref 19–32)
Creatinine, Ser: 2.19 mg/dL — ABNORMAL HIGH (ref 0.50–1.10)
GFR, EST AFRICAN AMERICAN: 32 mL/min — AB (ref >90)
GFR, EST NON AFRICAN AMERICAN: 28 mL/min — AB (ref >90)
GLUCOSE: 199 mg/dL — AB (ref 70–99)
Potassium: 3.7 mEq/L (ref 3.7–5.3)
SODIUM: 135 meq/L — AB (ref 137–147)
Total Bilirubin: 0.5 mg/dL (ref 0.3–1.2)
Total Protein: 7.8 g/dL (ref 6.0–8.3)

## 2013-11-23 LAB — RENAL FUNCTION PANEL
ALBUMIN: 1.7 g/dL — AB (ref 3.5–5.2)
ANION GAP: 12 (ref 5–15)
BUN: 53 mg/dL — ABNORMAL HIGH (ref 6–23)
CO2: 25 mEq/L (ref 19–32)
Calcium: 7.9 mg/dL — ABNORMAL LOW (ref 8.4–10.5)
Chloride: 98 mEq/L (ref 96–112)
Creatinine, Ser: 1.63 mg/dL — ABNORMAL HIGH (ref 0.50–1.10)
GFR calc Af Amer: 46 mL/min — ABNORMAL LOW (ref >90)
GFR calc non Af Amer: 39 mL/min — ABNORMAL LOW (ref >90)
Glucose, Bld: 166 mg/dL — ABNORMAL HIGH (ref 70–99)
POTASSIUM: 4.3 meq/L (ref 3.7–5.3)
Phosphorus: 3.5 mg/dL (ref 2.3–4.6)
Sodium: 135 mEq/L — ABNORMAL LOW (ref 137–147)

## 2013-11-23 LAB — CULTURE, BLOOD (ROUTINE X 2)

## 2013-11-23 LAB — GLUCOSE, CAPILLARY
GLUCOSE-CAPILLARY: 189 mg/dL — AB (ref 70–99)
Glucose-Capillary: 178 mg/dL — ABNORMAL HIGH (ref 70–99)
Glucose-Capillary: 214 mg/dL — ABNORMAL HIGH (ref 70–99)
Glucose-Capillary: 148 mg/dL — ABNORMAL HIGH (ref 70–99)
Glucose-Capillary: 141 mg/dL — ABNORMAL HIGH (ref 70–99)
Glucose-Capillary: 161 mg/dL — ABNORMAL HIGH (ref 70–99)

## 2013-11-23 LAB — CYCLIC CITRUL PEPTIDE ANTIBODY, IGG: Cyclic Citrullin Peptide Ab: <2 U/mL (ref 0.0–5.0)

## 2013-11-23 LAB — POCT ACTIVATED CLOTTING TIME
ACTIVATED CLOTTING TIME: 174 s
ACTIVATED CLOTTING TIME: 202 s
ACTIVATED CLOTTING TIME: 236 s
ACTIVATED CLOTTING TIME: 230 s
ACTIVATED CLOTTING TIME: 236 s
ACTIVATED CLOTTING TIME: 247 s
ACTIVATED CLOTTING TIME: 225 s
Activated Clotting Time: 163 seconds
Activated Clotting Time: 168 seconds
Activated Clotting Time: 169 seconds
Activated Clotting Time: 163 seconds
Activated Clotting Time: 168 seconds
Activated Clotting Time: 202 seconds
Activated Clotting Time: 202 seconds
Activated Clotting Time: 208 seconds
Activated Clotting Time: 225 seconds
Activated Clotting Time: 231 seconds
Activated Clotting Time: 236 seconds

## 2013-11-23 LAB — ANTISTREPTOLYSIN O TITER: ASO: 64 [IU]/mL (ref <409)

## 2013-11-23 LAB — POCT I-STAT 3, ART BLOOD GAS (G3+)
Acid-base deficit: 1 mmol/L (ref 0.0–2.0)
BICARBONATE: 24.9 meq/L — AB (ref 20.0–24.0)
O2 SAT: 88 %
TCO2: 26 mmol/L (ref 0–100)
pCO2 arterial: 44 mmHg (ref 35.0–45.0)
pH, Arterial: 7.358 (ref 7.350–7.450)
pO2, Arterial: 56 mmHg — ABNORMAL LOW (ref 80.0–100.0)

## 2013-11-23 LAB — MPO/PR-3 (ANCA) ANTIBODIES: Serine Protease 3: <1

## 2013-11-23 LAB — VANCOMYCIN, RANDOM: Vancomycin Rm: 13 ug/mL

## 2013-11-23 LAB — RESPIRATORY VIRUS PANEL
Adenovirus: NOT DETECTED
INFLUENZA A H3: NOT DETECTED
Influenza A H1: NOT DETECTED
Influenza A: NOT DETECTED
Influenza B: NOT DETECTED
METAPNEUMOVIRUS: NOT DETECTED
PARAINFLUENZA 1 A: NOT DETECTED
Parainfluenza 2: NOT DETECTED
Parainfluenza 3: NOT DETECTED
RESPIRATORY SYNCYTIAL VIRUS A: NOT DETECTED
RHINOVIRUS: DETECTED — AB
Respiratory Syncytial Virus B: NOT DETECTED

## 2013-11-23 LAB — C3 COMPLEMENT: C3 Complement: 105 mg/dL (ref 90–180)

## 2013-11-23 LAB — CBC
HEMATOCRIT: 21.8 % — AB (ref 36.0–46.0)
Hemoglobin: 7.8 g/dL — ABNORMAL LOW (ref 12.0–15.0)
MCH: 28.6 pg (ref 26.0–34.0)
MCHC: 35.8 g/dL (ref 30.0–36.0)
MCV: 79.9 fL (ref 78.0–100.0)
Platelets: 129 10*3/uL — ABNORMAL LOW (ref 150–400)
RBC: 2.73 MIL/uL — AB (ref 3.87–5.11)
RDW: 14.7 % (ref 11.5–15.5)
WBC: 12.3 10*3/uL — ABNORMAL HIGH (ref 4.0–10.5)

## 2013-11-23 LAB — APTT: APTT: 37 s (ref 24–37)

## 2013-11-23 LAB — MAGNESIUM: MAGNESIUM: 2.6 mg/dL — AB (ref 1.5–2.5)

## 2013-11-23 LAB — ANCA SCREEN W REFLEX TITER
Atypical p-ANCA Screen: NEGATIVE
C-ANCA SCREEN: NEGATIVE
p-ANCA Screen: NEGATIVE

## 2013-11-23 LAB — C4 COMPLEMENT: COMPLEMENT C4, BODY FLUID: 10 mg/dL — AB (ref 10–40)

## 2013-11-23 LAB — ANTI-DNA ANTIBODY, DOUBLE-STRANDED: DS DNA AB: 1 [IU]/mL

## 2013-11-23 LAB — PHOSPHORUS: PHOSPHORUS: 3.3 mg/dL (ref 2.3–4.6)

## 2013-11-23 MED ORDER — DEXTROSE 5 % IV SOLN
1.0000 g | Freq: Once | INTRAVENOUS | Status: AC
  Administered 2013-11-23: 1 g via INTRAVENOUS
  Filled 2013-11-23: qty 10

## 2013-11-23 MED ORDER — HYDROCORTISONE NA SUCCINATE PF 100 MG IJ SOLR
50.0000 mg | Freq: Three times a day (TID) | INTRAMUSCULAR | Status: DC
Start: 2013-11-23 — End: 2013-11-24
  Administered 2013-11-23 – 2013-11-24 (×2): 50 mg via INTRAVENOUS
  Filled 2013-11-23 (×5): qty 1

## 2013-11-23 MED ORDER — INSULIN ASPART 100 UNIT/ML ~~LOC~~ SOLN
0.0000 [IU] | SUBCUTANEOUS | Status: DC
  Administered 2013-11-23: 4 [IU] via SUBCUTANEOUS
  Administered 2013-11-23 (×2): 3 [IU] via SUBCUTANEOUS
  Administered 2013-11-23: 7 [IU] via SUBCUTANEOUS
  Administered 2013-11-24 (×4): 3 [IU] via SUBCUTANEOUS
  Administered 2013-11-24 (×2): 4 [IU] via SUBCUTANEOUS
  Administered 2013-11-25 (×3): 3 [IU] via SUBCUTANEOUS
  Administered 2013-11-25 – 2013-11-26 (×3): 4 [IU] via SUBCUTANEOUS

## 2013-11-23 MED ORDER — VANCOMYCIN HCL 10 G IV SOLR
1250.0000 mg | INTRAVENOUS | Status: DC
  Administered 2013-11-23: 1250 mg via INTRAVENOUS
  Filled 2013-11-23: qty 1250

## 2013-11-23 MED ORDER — INSULIN GLARGINE 100 UNIT/ML ~~LOC~~ SOLN
5.0000 [IU] | Freq: Every day | SUBCUTANEOUS | Status: DC
  Administered 2013-11-23 – 2013-11-25 (×3): 5 [IU] via SUBCUTANEOUS
  Filled 2013-11-23 (×4): qty 0.05

## 2013-11-23 MED ORDER — DEXTROSE 5 % IV SOLN
2.0000 g | INTRAVENOUS | Status: DC
  Administered 2013-11-24 – 2013-11-26 (×3): 2 g via INTRAVENOUS
  Filled 2013-11-23 (×4): qty 2

## 2013-11-23 NOTE — Progress Notes (Signed)
PULMONARY  / CRITICAL CARE MEDICINE CONSULTATION   Name: Courtney Cortez MRN: [id number] DOB: 12/16/1975    ADMISSION DATE:  11/24/2013 CONSULTATION DATE: 11/20/2013  REQUESTING CLINICIAN: Dr. Daryll Drown PRIMARY SERVICE: FM  CHIEF COMPLAINT:  SOB/CP  BRIEF PATIENT DESCRIPTION: 48 F with arthritis who presented to [facility] on 10/22 with CP and SOB thought to be 2/2 pericarditis. Developed BL pleural effusions & Markedly tachypnic and tachycardic on 10/25 and PCCM asked to evaluate.  Blood cultures subsequently showed pneumococcus  SIGNIFICANT EVENTS  10/23 Admitted 10/25 Intubated 10/27 CRRT  STUDIES:  10/21 PE protocol CT >> No PE but bilateral "atelectasis" 10/22 PE Protocol CT >> No PE but worsened bilateral basilar disease 10/22 Echo >> Trivial pericardial effusion, No evidence of tamponade 10/23 HIV negative 10/24 Echo >> Trivial pericardial effusion, No evidence of tamponade 10/25 >> ABG 7.315/32.8/59.4 10/25 ESR 130 10/26 Renal U/S >> Increased echogenicity, no hydronephrosis 10/26 Bl effusions but too small to tap 10/27 CXR >> Worsening effusions CK 492 --> 200  LINES / TUBES: LIJ 10/25 >> ETT 10/25 >> RIJ HD cath 10/27 >>  CULTURES: Blood Culture x 2 10/25 >> pneumococcus Urine Culture 10/25>> neg Sputum Culture 10/25>> S. pneumoniae Resp Viral Panel 10/25>>  Strep pneumo urinary Ag >> POSITIVE   ANTIBIOTICS: CTX 10/25 - Azithro 10/25-10/28   SUBJECTIVE: Afebrile Intubated, RASS -1on precedex gtt , agitated on WUA  oliguric  VITAL SIGNS: Temp:  [92.6 F (33.7 C)-98.5 F (36.9 C)] 92.8 F (33.8 C) (10/28 0400) Pulse Rate:  [52-162] 70 (10/28 0745) Resp:  [14-27] 24 (10/28 0745) BP: (84-115)/(44-88) 89/59 mmHg (10/28 0745) SpO2:  [96 %-100 %] 98 % (10/28 0745) FiO2 (%):  [30 %] 30 % (10/28 0500) Weight:  [248 lb 10.9 oz (112.8 kg)] 248 lb 10.9 oz (112.8 kg) (10/28 0420) HEMODYNAMICS:   VENTILATOR SETTINGS: Vent Mode:  [-] PRVC FiO2 (%):  [30 %] 30  % Set Rate:  [24 bmp] 24 bmp Vt Set:  [510 mL] 510 mL PEEP:  [5 cmH20] 5 cmH20 Pressure Support:  [5 cmH20] 5 cmH20 Plateau Pressure:  [24 cmH20-28 cmH20] 24 cmH20 INTAKE / OUTPUT: Intake/Output     10/27 0701 - 10/28 0700 10/28 0701 - 10/29 0700   I.V. (mL/kg) 1674 (14.8)    NG/GT 895    IV Piggyback 175    Total Intake(mL/kg) 2744 (24.3)    Urine (mL/kg/hr) 230 (0.1)    Other 1677 (0.6)    Total Output 1907     Net +837          Stool Occurrence 1 x      PHYSICAL EXAMINATION: General:  Obese F intubated, sedated but responds to pain Neuro:  RASS -2 HEENT:  Sclera anicteric, conjunctiva pink, MMM, OP clear  Neck: Trachea supple and midline, no JVD; L IJ and R HD cath  Cardiovascular:  RRR, NS1/S2, II/VI holosystolic murmur Lungs: Diminished at bases. Abdomen:  S/NT/ND/(+)BS Musculoskeletal: No deformities; 1+ pitting edema bilaterally Skin:  Intact, stable purpuric rash over BLEs  LABS:  CBC  Recent Labs Lab 11/21/13 0450 11/22/13 0415 11/23/13 0400  WBC 12.2* 20.3* 12.3*  HGB 8.3* 7.9* 7.8*  HCT 24.2* 22.3* 21.8*  PLT 191 163 129*   Coag's  Recent Labs Lab 11/23/13 0400  APTT 37   BMET  Recent Labs Lab 11/22/13 0415 11/22/13 1600 11/23/13 0400  NA 135* 133* 135*  K 4.1 4.6 3.7  CL 94* 93* 96  CO2 20 23 23   BUN  87* 92* 65*  CREATININE 4.76* 4.18* 2.19*  GLUCOSE 180* 202* 199*   Electrolytes  Recent Labs Lab 11/19/13 0230  11/21/13 0450  11/22/13 0415 11/22/13 1600 11/23/13 0400  CALCIUM 8.8  < > 8.6  < > 8.2* 7.9* 7.7*  MG 2.0  --  2.7*  --   --   --  2.6*  PHOS  --   --  4.8*  < > 6.0* 6.6* 3.3  < > = values in this interval not displayed. Sepsis Markers  Recent Labs Lab 11/20/13 0820 11/20/13 1352 11/20/13 1846  LATICACIDVEN 5.3* 4.4* 2.8*   ABG  Recent Labs Lab 11/20/13 1618 11/21/13 1823 11/22/13 0220  PHART 7.257* 7.312* 7.304*  PCO2ART 42.2 39.7 38.6  PO2ART 193.0* 69.0* 77.6*   Liver Enzymes  Recent  Labs Lab 11/03/2013 1843  11/22/13 0415 11/22/13 1600 11/23/13 0400  AST 16  --  295*  --  300*  ALT 10  --  116*  --  145*  ALKPHOS 53  --  98  --  131*  BILITOT 0.5  --  0.7  --  0.5  ALBUMIN 2.9*  < > 1.7* 1.7* 1.6*  < > = values in this interval not displayed. Cardiac Enzymes  Recent Labs Lab 10/29/2013 1022  11/19/13 1641 11/19/13 2157 11/20/13 0820  TROPONINI <0.30  < > <0.30 <0.30 <0.30  PROBNP 577.2*  --   --   --  6391.0*  < > = values in this interval not displayed. Glucose  Recent Labs Lab 11/22/13 0820 11/22/13 1201 11/22/13 1611 11/22/13 2002 11/22/13 2341 11/23/13 0354  GLUCAP 184* 205* 192* 185* 178* 189*    Imaging US Renal  11/21/2013   CLINICAL DATA:  Acute renal failure. Hypertension. Patient on ventilator.  EXAM: RENAL/URINARY TRACT ULTRASOUND COMPLETE  COMPARISON:  CT 11/10/2013  FINDINGS: Right Kidney:  Length: 14.3 cm.  No hydronephrosis.  Mildly increased echogenicity.  Left Kidney:  Length: 13.2 cm. No hydronephrosis. Mildly increased echogenicity.  Bladder:  Collapsed around a Foley catheter.  Bilateral pleural effusions incidentally noted.  IMPRESSION: 1.  No hydronephrosis. 2. Increased renal echogenicity, suggesting medical renal disease. 3. Bilateral pleural effusions.   Electronically Signed   By: Abigail Miyamoto M.D.   On: 11/21/2013 17:43   Dg Chest Port 1 View  11/22/2013   CLINICAL DATA:  Status post placement of a right hemodialysis catheter.  EXAM: PORTABLE CHEST - 1 VIEW  COMPARISON:  11/22/2013 at 4:54 a.m.  FINDINGS: Right internal jugular central venous catheter has its tip in the lower superior vena cava just above the caval atrial junction. In lies adjacent to the tip of the left internal jugular central venous catheter, which is stable.  No pneumothorax.  Endotracheal tube tip projects 2.2 cm above the chronic. Nasogastric tube passes below the diaphragm into the stomach.  There are bilateral effusions with central vascular congestion  and mild bilateral interstitial thickening. Cardiopericardial silhouette is mild to moderately enlarged. This is similar to the earlier exam allowing for differences in positioning and lung volumes.  IMPRESSION: 1. Hemodialysis catheter tip is well positioned in the lower superior vena cava. 2. No pneumothorax. 3. Persistent pleural effusions and interstitial thickening with hazy mid and lower lung zone opacity and cardiomegaly, findings consistent with congestive heart failure.   Electronically Signed   By: Lajean Manes M.D.   On: 11/22/2013 13:33   Dg Chest Port 1 View  11/22/2013   CLINICAL DATA:  Hypoxia/shortness of breath  EXAM: PORTABLE CHEST - 1 VIEW  COMPARISON:  November 20, 2013.  FINDINGS: Endotracheal tube tip is 3.1 cm above the carina. Nasogastric tube tip and side port are below the diaphragm. Central catheter tip is at the cavoatrial junction. No apparent pneumothorax. There is cardiomegaly with bilateral effusions and mild interstitial edema. There is consolidation in both lower lobes, stable. No adenopathy. No bone lesions.  IMPRESSION: Tube and catheter positions as described without pneumothorax. Findings consistent with a degree of congestive heart failure. Consolidation in both lower lobe regions may well represent alveolar edema, although superimposed pneumonia cannot be excluded. Both congestive heart failure and pneumonia may exist concurrently.   Electronically Signed   By: Lowella Grip M.D.   On: 11/22/2013 07:22    EKG: Sinus Tach ,diffuse ST elevation, PR interval shortening CXR: 10/25 Bl effusions, left appears loculated  ASSESSMENT / PLAN:  PULMONARY A: Acute hypoxic respiratory failure: Favor pneumococcal sepsis,DDx included autoimmune pneumonitis/pleuritis. ? If there is an autoimmune process underlying all of her issues. Doubt aspiration Pleural effusions P:   SBTs -tolerates 5/5 but borderline ABG & acidotic breathing - hold off extubationx  24h   CARDIOVASCULAR A: Pleuritic Chest Pain 2/2 presumed pericarditis: nml Tp Septic shock 2/2 gram positive (likely pneumococcal) bacteremia P:  Off neo Stress-dose steroids  RENAL A: ARF: oliguric - ?ATN from contrast/ NSAIDs, hypotension ? presence of unifying autoimmune process, bland urinary sediment, low FENa AG Metabolic acidosis with respiratory compensation Pos RF, ANA (1:640), ESR high, pos ds-dna -favor SLE, complement low normal P:   CRRT with good solute clearance; electrolytes improving, marginal BP with negative balance - keep even. Daily renal function panel Await autoimmune panel - ANCA, Anti-GBM, ASO May require renal biopsy in future  GASTROINTESTINAL A: GERD Probable gastroparesis  P:   Cont PPI TFs @ 40cc/hr  Reglan  HEMATOLOGIC A: Anemia: chronic disease, high ferritin P:   Intermittent CBC  INFECTIOUS A:  pneumococcal sepsis P:  ceftx x 14 ds total Consider thora if fever/WC     ENDOCRINE A: Hyperglycemia: Still > 180 at times P:   Increase SSI to resistant scale, will titrate down with steroid taper Solu-cortef 13m q8h  NEUROLOGIC A:  Sedation requirement P: PAD - fent and precedex gtt, RASS goal -1   Family updated 10/27   Ryan B. GBonner Puna MD, PGY-2   TODAY'S SUMMARY: Tolerates 5/5 but borderline ABG & acidotic breathing - hold off extubationx 24h, sepsis resolving, hope for renal recovery here - if not , may need renal biopsy  The patient is critically ill with multiple organ systems failure and requires high complexity decision making for assessment and support, frequent evaluation and titration of therapies, application of advanced monitoring technologies and extensive interpretation of multiple databases. Critical Care Time devoted to patient care services described in this note is 35 minutes.   Care during the described time interval was provided by me and/or other providers on the critical care team.  I have reviewed  this patient's available data, including medical history, events of note, physical examination and test results as part of my evaluation  ALVA,RAKESH V. MD   11/23/2013 8:09 AM

## 2013-11-23 NOTE — Progress Notes (Signed)
Dr. Craige CottaSood made aware of patient temp 93.1 rectal. Bair hugger on max, warm blankets. Will continue to monitor.  Corliss SkainsJuan Deyonna Fitzsimmons RN

## 2013-11-23 NOTE — Progress Notes (Signed)
Subjective: Interval History: has no complaint,sedated, on vent.  Objective: Vital signs in last 24 hours: Temp:  [92.6 F (33.7 C)-98.5 F (36.9 C)] 92.8 F (33.8 C) (10/28 0400) Pulse Rate:  [52-162] 65 (10/28 0700) Resp:  [14-27] 24 (10/28 0700) BP: (84-115)/(44-88) 98/64 mmHg (10/28 0645) SpO2:  [96 %-100 %] 99 % (10/28 0700) FiO2 (%):  [30 %] 30 % (10/28 0500) Weight:  [112.8 kg (248 lb 10.9 oz)] 112.8 kg (248 lb 10.9 oz) (10/28 0420) Weight change: -1 kg (-2 lb 3.3 oz)  Intake/Output from previous day: 10/27 0701 - 10/28 0700 In: 2723.2 [I.V.:1653.2; NG/GT:895; IV Piggyback:175] Out: 1907 [Urine:230] Intake/Output this shift:    General appearance: sedated , on vent , responds to pain Neck: IJ cath Resp: rales bibasilar and rhonchi bilaterally Cardio: S1, S2 normal and systolic murmur: holosystolic 2/6, blowing at apex GI: mild distension,pos bs, liver down 4 cm Extremities: edema 2 +  Lab Results:  Recent Labs  11/22/13 0415 11/23/13 0400  WBC 20.3* 12.3*  HGB 7.9* 7.8*  HCT 22.3* 21.8*  PLT 163 129*   BMET:  Recent Labs  11/22/13 1600 11/23/13 0400  NA 133* 135*  K 4.6 3.7  CL 93* 96  CO2 23 23  GLUCOSE 202* 199*  BUN 92* 65*  CREATININE 4.18* 2.19*  CALCIUM 7.9* 7.7*   No results found for this basename: PTH,  in the last 72 hours Iron Studies:  Recent Labs  11/20/13 0820  IRON <10*  TIBC Not calculated due to Iron <10.  FERRITIN 2168*    Studies/Results: [place] Renal  11/21/2013   CLINICAL DATA:  Acute renal failure. Hypertension. Patient on ventilator.  EXAM: RENAL/URINARY TRACT ULTRASOUND COMPLETE  COMPARISON:  CT 2013/08/08  FINDINGS: Right Kidney:  Length: 14.3 cm.  No hydronephrosis.  Mildly increased echogenicity.  Left Kidney:  Length: 13.2 cm. No hydronephrosis. Mildly increased echogenicity.  Bladder:  Collapsed around a Foley catheter.  Bilateral pleural effusions incidentally noted.  IMPRESSION: 1.  No hydronephrosis. 2. Increased  renal echogenicity, suggesting medical renal disease. 3. Bilateral pleural effusions.   Electronically Signed   By: Jeronimo GreavesKyle  Talbot M.D.   On: 11/21/2013 17:43   Dg Chest Port 1 View  11/22/2013   CLINICAL DATA:  Status post placement of a right hemodialysis catheter.  EXAM: PORTABLE CHEST - 1 VIEW  COMPARISON:  11/22/2013 at 4:54 a.m.  FINDINGS: Right internal jugular central venous catheter has its tip in the lower superior vena cava just above the caval atrial junction. In lies adjacent to the tip of the left internal jugular central venous catheter, which is stable.  No pneumothorax.  Endotracheal tube tip projects 2.2 cm above the chronic. Nasogastric tube passes below the diaphragm into the stomach.  There are bilateral effusions with central vascular congestion and mild bilateral interstitial thickening. Cardiopericardial silhouette is mild to moderately enlarged. This is similar to the earlier exam allowing for differences in positioning and lung volumes.  IMPRESSION: 1. Hemodialysis catheter tip is well positioned in the lower superior vena cava. 2. No pneumothorax. 3. Persistent pleural effusions and interstitial thickening with hazy mid and lower lung zone opacity and cardiomegaly, findings consistent with congestive heart failure.   Electronically Signed   By: Amie Portlandavid  Ormond M.D.   On: 11/22/2013 13:33   Dg Chest Port 1 View  11/22/2013   CLINICAL DATA:  Hypoxia/shortness of breath  EXAM: PORTABLE CHEST - 1 VIEW  COMPARISON:  November 20, 2013.  FINDINGS: Endotracheal tube tip  is 3.1 cm above the carina. Nasogastric tube tip and side port are below the diaphragm. Central catheter tip is at the cavoatrial junction. No apparent pneumothorax. There is cardiomegaly with bilateral effusions and mild interstitial edema. There is consolidation in both lower lobes, stable. No adenopathy. No bone lesions.  IMPRESSION: Tube and catheter positions as described without pneumothorax. Findings consistent with a  degree of congestive heart failure. Consolidation in both lower lobe regions may well represent alveolar edema, although superimposed pneumonia cannot be excluded. Both congestive heart failure and pneumonia may exist concurrently.   Electronically Signed   By: Bretta BangWilliam  Woodruff M.D.   On: 11/22/2013 07:22    I have reviewed the patient's current medications.  Assessment/Plan: 1  AKI oliguric, some urine now.  Good solute clearance, acid/base/K with CRRT.  BP marginal,off pressors, keep even now.  Most likely toxic with NSAIDs and contrast 2 Pneu 3 VDRF per CCM 4 Gram pos sepsis on AB 5 Anemia 6 Nutrition TF 7 ? Autoimmune disease  Pos ANA, low C4 but C3 normal so not as likely SLE but will need bx once stable if not improving P CRRT, even, stop bicarb, follow urine.  AB , TF   LOS: 6 days   Courtney Cortez 11/23/2013,7:08 AM

## 2013-11-23 NOTE — Progress Notes (Signed)
EKG results reported to Courtney Junkeryan Grunz MD Barbaraann FasterJennifer Dereon Corkery RN

## 2013-11-23 NOTE — Progress Notes (Signed)
EKG CRITICAL VALUE     12 lead EKG performed.  Critical value noted. Barbaraann FasterJennifer Bishop, RN notified.   Junior Kenedy, CCT 11/23/2013 2:51 PM

## 2013-11-24 ENCOUNTER — Encounter (HOSPITAL_COMMUNITY): Payer: Self-pay | Admitting: Radiology

## 2013-11-24 ENCOUNTER — Inpatient Hospital Stay (HOSPITAL_COMMUNITY): Payer: PRIVATE HEALTH INSURANCE

## 2013-11-24 DIAGNOSIS — G9340 Encephalopathy, unspecified: Secondary | ICD-10-CM

## 2013-11-24 LAB — CBC
HCT: 22.7 % — ABNORMAL LOW (ref 36.0–46.0)
Hemoglobin: 7.9 g/dL — ABNORMAL LOW (ref 12.0–15.0)
MCH: 28.5 pg (ref 26.0–34.0)
MCHC: 34.8 g/dL (ref 30.0–36.0)
MCV: 81.9 fL (ref 78.0–100.0)
PLATELETS: 132 10*3/uL — AB (ref 150–400)
RBC: 2.77 MIL/uL — ABNORMAL LOW (ref 3.87–5.11)
RDW: 14.9 % (ref 11.5–15.5)
WBC: 19.6 10*3/uL — AB (ref 4.0–10.5)

## 2013-11-24 LAB — MPO/PR-3 (ANCA) ANTIBODIES
Myeloperoxidase Abs: <1
Serine Protease 3: <1

## 2013-11-24 LAB — CULTURE, RESPIRATORY

## 2013-11-24 LAB — POCT ACTIVATED CLOTTING TIME
ACTIVATED CLOTTING TIME: 202 s
ACTIVATED CLOTTING TIME: 225 s
Activated Clotting Time: 208 seconds
Activated Clotting Time: 208 seconds
Activated Clotting Time: 208 seconds

## 2013-11-24 LAB — GLUCOSE, CAPILLARY
GLUCOSE-CAPILLARY: 160 mg/dL — AB (ref 70–99)
GLUCOSE-CAPILLARY: 141 mg/dL — AB (ref 70–99)
Glucose-Capillary: 136 mg/dL — ABNORMAL HIGH (ref 70–99)
Glucose-Capillary: 139 mg/dL — ABNORMAL HIGH (ref 70–99)
Glucose-Capillary: 151 mg/dL — ABNORMAL HIGH (ref 70–99)
Glucose-Capillary: 153 mg/dL — ABNORMAL HIGH (ref 70–99)

## 2013-11-24 LAB — RENAL FUNCTION PANEL
ALBUMIN: 1.7 g/dL — AB (ref 3.5–5.2)
Anion gap: 11 (ref 5–15)
BUN: 45 mg/dL — ABNORMAL HIGH (ref 6–23)
CALCIUM: 8 mg/dL — AB (ref 8.4–10.5)
CO2: 25 mEq/L (ref 19–32)
CREATININE: 1.19 mg/dL — AB (ref 0.50–1.10)
Chloride: 101 mEq/L (ref 96–112)
GFR calc Af Amer: 67 mL/min — ABNORMAL LOW (ref >90)
GFR, EST NON AFRICAN AMERICAN: 58 mL/min — AB (ref >90)
Glucose, Bld: 139 mg/dL — ABNORMAL HIGH (ref 70–99)
Phosphorus: 2.3 mg/dL (ref 2.3–4.6)
Potassium: 4.1 mEq/L (ref 3.7–5.3)
Sodium: 137 mEq/L (ref 137–147)

## 2013-11-24 LAB — BLOOD GAS, ARTERIAL
ACID-BASE DEFICIT: 1.1 mmol/L (ref 0.0–2.0)
BICARBONATE: 23.6 meq/L (ref 20.0–24.0)
Drawn by: 347621
FIO2: 0.5 %
O2 Saturation: 98.8 %
PCO2 ART: 42.5 mmHg (ref 35.0–45.0)
PEEP: 5 cmH2O
Patient temperature: 98.1
RATE: 15 resp/min
TCO2: 25 mmol/L (ref 0–100)
VT: 510 mL
pH, Arterial: 7.363 (ref 7.350–7.450)
pO2, Arterial: 122 mmHg — ABNORMAL HIGH (ref 80.0–100.0)

## 2013-11-24 LAB — MAGNESIUM: MAGNESIUM: 2.5 mg/dL (ref 1.5–2.5)

## 2013-11-24 LAB — DIFFERENTIAL
BASOS PCT: 1 % (ref 0–1)
Basophils Absolute: 0.2 10*3/uL — ABNORMAL HIGH (ref 0.0–0.1)
Eosinophils Absolute: 0 10*3/uL (ref 0.0–0.7)
Eosinophils Relative: 0 % (ref 0–5)
LYMPHS PCT: 5 % — AB (ref 12–46)
Lymphs Abs: 1 10*3/uL (ref 0.7–4.0)
MONOS PCT: 13 % — AB (ref 3–12)
Monocytes Absolute: 2.5 10*3/uL — ABNORMAL HIGH (ref 0.1–1.0)
NEUTROS PCT: 81 % — AB (ref 43–77)
Neutro Abs: 15.9 10*3/uL — ABNORMAL HIGH (ref 1.7–7.7)

## 2013-11-24 LAB — COMPREHENSIVE METABOLIC PANEL
ALBUMIN: 1.7 g/dL — AB (ref 3.5–5.2)
ALK PHOS: 153 U/L — AB (ref 39–117)
ALT: 168 U/L — ABNORMAL HIGH (ref 0–35)
AST: 296 U/L — AB (ref 0–37)
Anion gap: 12 (ref 5–15)
BILIRUBIN TOTAL: 0.5 mg/dL (ref 0.3–1.2)
BUN: 48 mg/dL — AB (ref 6–23)
CHLORIDE: 98 meq/L (ref 96–112)
CO2: 25 mEq/L (ref 19–32)
Calcium: 8.1 mg/dL — ABNORMAL LOW (ref 8.4–10.5)
Creatinine, Ser: 1.28 mg/dL — ABNORMAL HIGH (ref 0.50–1.10)
GFR calc Af Amer: 61 mL/min — ABNORMAL LOW (ref >90)
GFR calc non Af Amer: 53 mL/min — ABNORMAL LOW (ref >90)
Glucose, Bld: 151 mg/dL — ABNORMAL HIGH (ref 70–99)
POTASSIUM: 4.3 meq/L (ref 3.7–5.3)
SODIUM: 135 meq/L — AB (ref 137–147)
TOTAL PROTEIN: 8 g/dL (ref 6.0–8.3)

## 2013-11-24 LAB — CULTURE, RESPIRATORY W GRAM STAIN

## 2013-11-24 LAB — APTT: aPTT: 50 seconds — ABNORMAL HIGH (ref 24–37)

## 2013-11-24 LAB — GLOMERULAR BASEMENT MEMBRANE ANTIBODIES: GBM Ab: <1

## 2013-11-24 LAB — PHOSPHORUS: PHOSPHORUS: 2.3 mg/dL (ref 2.3–4.6)

## 2013-11-24 LAB — ANTI-DNA ANTIBODY, DOUBLE-STRANDED: ds DNA Ab: 2 IU/mL

## 2013-11-24 LAB — TRIGLYCERIDES: Triglycerides: 281 mg/dL — ABNORMAL HIGH (ref <150)

## 2013-11-24 MED ORDER — PHENYLEPHRINE HCL 10 MG/ML IJ SOLN
0.0000 ug/min | INTRAVENOUS | Status: DC
  Administered 2013-11-25: 40 ug/min via INTRAVENOUS
  Administered 2013-11-25: 80 ug/min via INTRAVENOUS
  Administered 2013-11-26: 152 ug/min via INTRAVENOUS
  Administered 2013-11-26: 15 ug/min via INTRAVENOUS
  Administered 2013-11-26: 200 ug/min via INTRAVENOUS
  Filled 2013-11-24 (×4): qty 4

## 2013-11-24 MED ORDER — MIDAZOLAM HCL 2 MG/2ML IJ SOLN
4.0000 mg | Freq: Once | INTRAMUSCULAR | Status: AC
  Administered 2013-11-24: 4 mg via INTRAVENOUS

## 2013-11-24 MED ORDER — HALOPERIDOL LACTATE 5 MG/ML IJ SOLN
1.0000 mg | INTRAMUSCULAR | Status: DC | PRN
  Administered 2013-11-24: 4 mg via INTRAVENOUS

## 2013-11-24 MED ORDER — LIDOCAINE HCL (PF) 1 % IJ SOLN
INTRAMUSCULAR | Status: AC
  Filled 2013-11-24: qty 10

## 2013-11-24 MED ORDER — ETOMIDATE 2 MG/ML IV SOLN
INTRAVENOUS | Status: AC
  Administered 2013-11-24: 20 mg
  Filled 2013-11-24: qty 10

## 2013-11-24 MED ORDER — FENTANYL CITRATE 0.05 MG/ML IJ SOLN
INTRAMUSCULAR | Status: AC
  Administered 2013-11-24: 100 ug via INTRAVENOUS
  Filled 2013-11-24: qty 4

## 2013-11-24 MED ORDER — PROPOFOL 10 MG/ML IV EMUL
0.0000 ug/kg/min | INTRAVENOUS | Status: DC
  Administered 2013-11-24: 20 ug/kg/min via INTRAVENOUS
  Administered 2013-11-24 – 2013-11-25 (×3): 45 ug/kg/min via INTRAVENOUS
  Administered 2013-11-25: 40 ug/kg/min via INTRAVENOUS
  Filled 2013-11-24 (×7): qty 100

## 2013-11-24 MED ORDER — ETOMIDATE 2 MG/ML IV SOLN
INTRAVENOUS | Status: AC
  Administered 2013-11-24: 10 mg
  Filled 2013-11-24: qty 10

## 2013-11-24 MED ORDER — MIDAZOLAM HCL 2 MG/2ML IJ SOLN
INTRAMUSCULAR | Status: AC
  Administered 2013-11-24: 4 mg via INTRAVENOUS
  Filled 2013-11-24: qty 4

## 2013-11-24 MED ORDER — HALOPERIDOL LACTATE 5 MG/ML IJ SOLN
INTRAMUSCULAR | Status: AC
  Filled 2013-11-24: qty 1

## 2013-11-24 MED ORDER — FENTANYL CITRATE 0.05 MG/ML IJ SOLN: 50.0000 ug | INTRAMUSCULAR | Status: DC | PRN

## 2013-11-24 MED ORDER — MIDAZOLAM HCL 2 MG/2ML IJ SOLN
INTRAMUSCULAR | Status: AC
  Filled 2013-11-24: qty 2

## 2013-11-24 MED ORDER — MIDAZOLAM HCL 2 MG/2ML IJ SOLN
1.0000 mg | Freq: Once | INTRAMUSCULAR | Status: AC
  Administered 2013-11-24: 2 mg via INTRAVENOUS

## 2013-11-24 MED ORDER — SODIUM CHLORIDE 0.9 % IV SOLN
0.0000 ug/h | INTRAVENOUS | Status: DC
  Administered 2013-11-24: 100 ug/h via INTRAVENOUS
  Administered 2013-11-25: 400 ug/h via INTRAVENOUS
  Administered 2013-11-25: 300 ug/h via INTRAVENOUS
  Administered 2013-11-26: 400 ug/h via INTRAVENOUS
  Filled 2013-11-24 (×5): qty 50

## 2013-11-24 MED ORDER — FENTANYL CITRATE 0.05 MG/ML IJ SOLN
100.0000 ug | Freq: Once | INTRAMUSCULAR | Status: AC
  Administered 2013-11-24: 100 ug via INTRAVENOUS

## 2013-11-24 MED ORDER — HYDROCORTISONE NA SUCCINATE PF 100 MG IJ SOLR
50.0000 mg | Freq: Two times a day (BID) | INTRAMUSCULAR | Status: DC
  Administered 2013-11-24 – 2013-11-25 (×2): 50 mg via INTRAVENOUS
  Filled 2013-11-24 (×4): qty 1

## 2013-11-24 NOTE — Progress Notes (Signed)
Spoke with Dr. Dierdre ForthBeekman, rheum regarding likely new diagnosis of lupus Difficult to say regarding steroid course at this point  Would cont on stress dose for now while intubated  May be able to taper to 60mg  pred with plan for prolonged course However will defer until pt more hemodynamically stable  They are happy to pick her up on d/c, expect phone call from inpt team when close to discharge [facility], [facility]

## 2013-11-24 NOTE — Progress Notes (Signed)
IR asked to eval for Thoracentesis Obtained consent from husband. Brought US to bedside. (R)effusion is noted to be severely loculated. No free fluid is noted. Thoracentesis was not attempted See PACS imaging for details.  Brayton ElKevin Rodel Glaspy PA-C Interventional Radiology 11/24/2013 2:15 PM

## 2013-11-24 NOTE — Progress Notes (Signed)
NUTRITION FOLLOW UP  Intervention:    Continue Vital High Protein at 40 ml/h (960 ml per day) with Prostat 30 ml TID to provide 1260 kcals, 129 gm protein, 806 ml free water daily.  Above TF regimen plus current rate of Propofol will provide a total of 1434 kcals per day (65% of estimated needs).  Nutrition Dx:   Inadequate oral intake related to inability to eat as evidenced by NPO. Ongoing.  Goal:   Enteral nutrition to provide 60-70% of estimated calorie needs (22-25 kcals/kg ideal body weight) and 100% of estimated protein needs, based on ASPEN guidelines for hypocaloric, high protein feeding in critically ill obese individuals  Monitor:   TF tolerance/adequacy, weight trend, labs, vent status.  Assessment:   38 y.o. year-old without specific PMH who presented to ED on 10/20 in the evening reporting severe chest pain radiating to the back. Taking nsaid's w/o relief. Had chest CT angio which negative for PE or other pathology and was dc'd home. Returned to ED on 10/22 in the morning with worsening pleuritic CP. Pt was admitted and CT angio of chest was repeated showing this time a consolidation of the R lower lobe and less consolidation of the L lower lobe. She was admitted and rx with high dose NSAID's, Norco, colchicine for possible pericarditis. This am she became more SOB and CCM was called for respiratory distress. Pt was moved to ICU and put on Bipap. She did not improve and was intubated this afternoon.  Patient was extubated this morning, but required re-intubation. TF held for a few hours after intubation, but has been resumed. Patient still receiving CRRT. Currently receiving Vital High Protein at 40 ml/h with Prostat 30 ml TID providing 1260 kcals, 129 gm protein, 806 ml free water daily.  Patient is currently intubated on ventilator support MV: 13.2 L/min Temp (24hrs), Avg:96.8 F (36 C), Min:93.1 F (33.9 C), Max:98.5 F (36.9 C)  Propofol: 6.6 ml/hr providing 174 kcals  per day.   Height: Ht Readings from Last 1 Encounters:  11/06/2013 5\' 8"  (1.727 m)    Weight Status:   Wt Readings from Last 1 Encounters:  11/24/13 250 lb 10.6 oz (113.7 kg)  11/21/13  244 lb 4.3 oz (110.8 kg)   Re-estimated needs:  Kcal: 2200 Protein: 127-159 gm Fluid: 2.2 L  Skin: intact  Diet Order: NPO   Intake/Output Summary (Last 24 hours) at 11/24/13 1537 Last data filed at 11/24/13 1500  Gross per 24 hour  Intake 1938.39 ml  Output   1990 ml  Net -51.61 ml    Last BM: 10/29   Labs:   Recent Labs Lab 11/21/13 0450  11/23/13 0400 11/23/13 1600 11/24/13 0407  NA 135*  < > 135* 135* 135*  K 4.3  < > 3.7 4.3 4.3  CL 97  < > 96 98 98  CO2 18*  < > 23 25 25   BUN 51*  < > 65* 53* 48*  CREATININE 3.58*  < > 2.19* 1.63* 1.28*  CALCIUM 8.6  < > 7.7* 7.9* 8.1*  MG 2.7*  --  2.6*  --  2.5  PHOS 4.8*  < > 3.3 3.5 2.3  GLUCOSE 133*  < > 199* 166* 151*  < > = values in this interval not displayed.  CBG (last 3)   Recent Labs  11/24/13 0810 11/24/13 1146 11/24/13 1505  GLUCAP 136* 153* 141*    Scheduled Meds: . antiseptic oral rinse  7 mL Mouth Rinse QID  .  cefTRIAXone (ROCEPHIN)  IV  2 g Intravenous Q24H  . chlorhexidine  15 mL Mouth Rinse BID  . feeding supplement (PRO-STAT SUGAR FREE 64)  30 mL Oral TID WC  . feeding supplement (VITAL HIGH PROTEIN)  1,000 mL Per Tube Q24H  . hydrocortisone sod succinate (SOLU-CORTEF) inj  50 mg Intravenous Q12H  . insulin aspart  0-20 Units Subcutaneous 6 times per day  . insulin glargine  5 Units Subcutaneous Daily  . lidocaine (PF)      . pantoprazole (PROTONIX) IV  40 mg Intravenous Daily    Continuous Infusions: . sodium chloride 10 mL/hr at 11/24/13 0830  . fentaNYL infusion INTRAVENOUS 100 mcg/hr (11/24/13 1320)  . phenylephrine (NEO-SYNEPHRINE) Adult infusion 30 mcg/min (11/24/13 1400)  . dialysis replacement fluid (prismasate) 700 mL/hr at 11/24/13 1013  . dialysis replacement fluid (prismasate) 800  mL/hr at 11/24/13 1154  . dialysate (PRISMASATE) 2,000 mL/hr at 11/24/13 1531  . propofol 15 mcg/kg/min (11/24/13 1400)     Joaquin CourtsKimberly Jayven Naill, RD, LDN, CNSC Pager [phone] After Hours Pager [phone]

## 2013-11-24 NOTE — Progress Notes (Signed)
Patient suddenly became very agitated, verbal de-escalation attempted with no success. Fentanyl and precedex infusing, 2 mg Versed given for agitation. Will continue to monitor.  Corliss SkainsJuan Jayziah Bankhead RN

## 2013-11-24 NOTE — Procedures (Signed)
Extubation Procedure Note  Patient Details:   Name: Courtney Cortez DOB: 10/17/1975 MRN: [id number]   Airway Documentation:     Evaluation  O2 sats: stable throughout Complications: No apparent complications Patient did tolerate procedure well. Bilateral Breath Sounds: Diminished Suctioning: Oral;Airway No  Pt on a wean of 5/5 since 08:05, but very anxious and tachypneic. Per Dr. Vassie LollAlva, extubate to 3L [place]. Pt vitals stable throughout. Unable to speak post extubation, as pt is confused and not following RN's commands.   Ermalinda BarriosKelley, Montserrat Shek M 11/24/2013, 8:26 AM

## 2013-11-24 NOTE — Progress Notes (Signed)
She remains critically ill with agitation on wakeup assessment. She remains on CRRT for volume issues, although somewhat improved and now off pressors. She passed a spontaneous breathing trial and was extubated. However after 2 hours, due to  severe encephalopathy, she had to be reintubated. She was able to maintain her airway and had a good cough.  She remains afebrile, but [place] count is increasing. We may have to sample her pleural fluid and I have asked IR to do so. On bedside ultrasound 2 days ago, I did not see sufficient fluid to tap. We may also have to consider imaging her head if she remains encephalopathic. We'll use propofol and fentanyl for sedation. It does seem like she has underlying lupus, which strongly positive ANA, and weakly positive double-stranded DNA. Her course does not appear to be CNS manifestation of lupus, but that remains in the differential now that pneumococcal sepsis appears to be resolving.  The patient is critically ill with multiple organ systems failure and requires high complexity decision making for assessment and support, frequent evaluation and titration of therapies, application of advanced monitoring technologies and extensive interpretation of multiple databases. Critical Care Time devoted to patient care services described in this note is 40 minutes.    Courtney MilchALVA,RAKESH V. MD

## 2013-11-24 NOTE — Procedures (Signed)
Intubation Procedure Note Courtney Cortez [id number] 09/16/1975  Procedure: Intubation Indications: Airway protection and maintenance  Procedure Details Consent: Unable to obtain consent because of altered level of consciousness. Time Out: Verified patient identification, verified procedure, site/side was marked, verified correct patient position, special equipment/implants available, medications/allergies/relevent history reviewed, required imaging and test results available.  Performed  Maximum sterile technique was used including hand hygiene and mask.  MAC and 3 Glidescope    Evaluation Hemodynamic Status: BP stable throughout; O2 sats: stable throughout Patient's Current Condition: stable Complications: No apparent complications Patient did tolerate procedure well. Chest X-ray ordered to verify placement.  CXR: pending.  Joneen RoachPaul Hoffman, ACNP [facility] Pager [phone] or [phone]  Performed under my direct supervision  Oretha MilchALVA,Aleesia Henney V. MD

## 2013-11-24 NOTE — Progress Notes (Signed)
eLink Physician-Brief Progress Note Patient Name: Baldemar Lenisonya Evans DOB: 07/25/1975 MRN: [id number]   Date of Service  11/24/2013  HPI/Events of Note  Thoracentesis canceled  eICU Interventions  Labs canceled     Intervention Category Minor Interventions: Routine modifications to care plan (e.g. PRN medications for pain, fever)  MCQUAID, DOUGLAS 11/24/2013, 4:04 PM

## 2013-11-24 NOTE — Progress Notes (Signed)
ANTIBIOTIC CONSULT NOTE - INITIAL  Pharmacy Consult for Rocephin  Indication: pneumococcal bacteremia and pneumonia  No Known Allergies  Patient Measurements: Height: 5' 8" (172.7 cm) Weight: 250 lb 10.6 oz (113.7 kg) IBW/kg (Calculated) : 63.9  Vital Signs: Temp: 98 F (36.7 C) (10/29 0827) Temp Source: Axillary (10/29 0827) BP: 115/61 mmHg (10/29 1000) Pulse Rate: 98 (10/29 1000)  Labs:  Recent Labs  11/22/13 0415  11/23/13 0400 11/23/13 1600 11/24/13 0407  WBC 20.3*  --  12.3*  --  19.6*  HGB 7.9*  --  7.8*  --  7.9*  PLT 163  --  129*  --  132*  CREATININE 4.76*  < > 2.19* 1.63* 1.28*  < > = values in this interval not displayed. Estimated Creatinine Clearance: 79.6 ml/min (by C-G formula based on Cr of 1.28).    Microbiology: Recent Results (from the past 720 hour(s))  CULTURE, BLOOD (ROUTINE X 2)     Status: None   Collection Time    11/20/13  8:20 AM      Result Value Ref Range Status   Specimen Description BLOOD RIGHT ARM   Final   Special Requests BOTTLES DRAWN AEROBIC AND ANAEROBIC 10CC   Final   Culture  Setup Time     Final   Value: 11/20/2013 22:31     Performed at [facility]   Culture     Final   Value: STREPTOCOCCUS PNEUMONIAE     Note: Gram Stain Report Called to,Read Back By and Verified With: CHRIS HAYES @ 6:30AM 10.26.15 BY DESIS     Performed at [facility]   Report Status 11/23/2013 FINAL   Final   Organism ID, Bacteria STREPTOCOCCUS PNEUMONIAE   Final  MRSA PCR SCREENING     Status: None   Collection Time    11/20/13  8:30 AM      Result Value Ref Range Status   MRSA by PCR NEGATIVE  NEGATIVE Final   Comment:            The GeneXpert MRSA Assay (FDA     approved for NASAL specimens     only), is one component of a     comprehensive MRSA colonization     surveillance program. It is not     intended to diagnose MRSA     infection nor to guide or     monitor treatment for     MRSA infections.  CULTURE, BLOOD  (ROUTINE X 2)     Status: None   Collection Time    11/20/13 10:00 AM      Result Value Ref Range Status   Specimen Description BLOOD LEFT FOREARM   Final   Special Requests BOTTLES DRAWN AEROBIC ONLY 5CC   Final   Culture  Setup Time     Final   Value: 11/20/2013 22:32     Performed at [facility]   Culture     Final   Value: STREPTOCOCCUS PNEUMONIAE     Note: SUSCEPTIBILITIES PERFORMED ON PREVIOUS CULTURE WITHIN THE LAST 5 DAYS.     Note: Gram Stain Report Called to,Read Back By and Verified With: CHRIS HAYES @ 6:30AM 10.26.15 BY DESIS     Performed at [facility]   Report Status 11/23/2013 FINAL   Final  CULTURE, RESPIRATORY (NON-EXPECTORATED)     Status: None   Collection Time    11/20/13  5:31 PM      Result Value Ref Range  Status   Specimen Description TRACHEAL ASPIRATE   Final   Special Requests NONE   Final   Gram Stain     Final   Value: ABUNDANT WBC PRESENT,BOTH PMN AND MONONUCLEAR     NO SQUAMOUS EPITHELIAL CELLS SEEN     MODERATE GRAM POSITIVE COCCI     IN PAIRS     Performed at [facility]   Culture     Final   Value: MODERATE STREPTOCOCCUS PNEUMONIAE     RARE CANDIDA ALBICANS     Performed at [facility]   Report Status 11/24/2013 FINAL   Final   Organism ID, Bacteria STREPTOCOCCUS PNEUMONIAE   Final  URINE CULTURE     Status: None   Collection Time    11/20/13 10:15 PM      Result Value Ref Range Status   Specimen Description URINE, CATHETERIZED   Final   Special Requests NONE   Final   Culture  Setup Time     Final   Value: 11/20/2013 22:46     Performed at [place]     Final   Value: NO GROWTH     Performed at [facility]   Culture     Final   Value: NO GROWTH     Performed at [facility]   Report Status 11/22/2013 FINAL   Final  RESPIRATORY VIRUS PANEL     Status: Abnormal   Collection Time    11/21/13  2:15 AM      Result Value Ref Range Status   Source - RVPAN  NASAL SWAB   Corrected   Comment: CORRECTED ON 10/28 AT 1315: PREVIOUSLY REPORTED AS NASAL SWAB   Respiratory Syncytial Virus A NOT DETECTED   Final   Respiratory Syncytial Virus B NOT DETECTED   Final   Influenza A NOT DETECTED   Final   Influenza B NOT DETECTED   Final   Parainfluenza 1 NOT DETECTED   Final   Parainfluenza 2 NOT DETECTED   Final   Parainfluenza 3 NOT DETECTED   Final   Metapneumovirus NOT DETECTED   Final   Rhinovirus DETECTED (*)  Final   Adenovirus NOT DETECTED   Final   Influenza A H1 NOT DETECTED   Final   Influenza A H3 NOT DETECTED   Final   Comment: (NOTE)           Normal Reference Range for each Analyte: NOT DETECTED     Testing performed using the Luminex xTAG Respiratory Viral Panel test     kit.     The analytical performance characteristics of this assay have been     determined by [facility].  The modifications have not been     cleared or approved by the FDA. This assay has been validated pursuant     to the CLIA regulations and is used for clinical purposes.     Performed at [facility] History: Past Medical History  Diagnosis Date  . Acid reflux disease   . Hypertension   . Arthritis     "joints"    Medications:  Prescriptions prior to admission  Medication Sig Dispense Refill  . aspirin EC 325 MG tablet Take 325 mg by mouth once.      . hydrochlorothiazide (HYDRODIURIL) 25 MG tablet Take 25 mg by mouth daily.      Marland Kitchen ibuprofen (ADVIL,MOTRIN) 200 MG tablet Take  400 mg by mouth every 6 (six) hours as needed for pain.      Marland Kitchen loratadine (CLARITIN) 10 MG tablet Take 10 mg by mouth daily.      . meloxicam (MOBIC) 7.5 MG tablet Take 7.5 mg by mouth daily.      Marland Kitchen HYDROcodone-acetaminophen (NORCO/VICODIN) 5-325 MG per tablet Take 1 tablet by mouth 2 (two) times daily as needed for severe pain.  6 tablet  0   Scheduled:  . antiseptic oral rinse  7 mL Mouth Rinse QID  . cefTRIAXone (ROCEPHIN)  IV  2 g Intravenous  Q24H  . chlorhexidine  15 mL Mouth Rinse BID  . feeding supplement (PRO-STAT SUGAR FREE 64)  30 mL Oral TID WC  . feeding supplement (VITAL HIGH PROTEIN)  1,000 mL Per Tube Q24H  . hydrocortisone sod succinate (SOLU-CORTEF) inj  50 mg Intravenous Q12H  . insulin aspart  0-20 Units Subcutaneous 6 times per day  . insulin glargine  5 Units Subcutaneous Daily  . pantoprazole (PROTONIX) IV  40 mg Intravenous Daily   Infusions:  . sodium chloride 10 mL/hr at 11/24/13 0830  . dexmedetomidine 1 mcg/kg/hr (11/24/13 1018)  . heparin 10,000 units/ 20 mL infusion syringe 1,100 Units/hr (11/24/13 0727)  . phenylephrine (NEO-SYNEPHRINE) Adult infusion Stopped (11/23/13 1500)  . dialysis replacement fluid (prismasate) 700 mL/hr at 11/24/13 1013  . dialysis replacement fluid (prismasate) 800 mL/hr at 11/23/13 2253  . dialysate (PRISMASATE) 2,000 mL/hr at 11/24/13 1013    Assessment: 14 YOF with pneumococcal bacteremia and pneumonia on Rocephin D#5, undergoing CRRT. WBC 19.6 (up, likely due to Solu-Cortef), afebrile.   10/25 blood >> strep pneumo (pan-sensitive) 10/25 sputum >> mod strep pneumo (pan-sensitive) 10/25 urine >> NG 10/26 RVP >>rhinovirus 10/26 strep antigen POS  Goal of Therapy:  Clinical resolution of infection  Plan:  -Continue Rocephin 2 g IV q24h (stop date 11/7, 14 days total of antibiotics) -Pharmacy will continue to monitor patient during CCM rounds, but will not require further antibiotic notes due to no dose adjustments with Rocephin.  Whitney Muse, PharmD Candidate  11/24/2013, 10:30 AM   I agree with above assessment and plan.  Sherlon Handing, PharmD, BCPS Clinical pharmacist, pager [phone] 11/24/2013  1:12 PM

## 2013-11-24 NOTE — Progress Notes (Signed)
Patient ID: Courtney Cortez, female    DOB: 10/26/1975, 38 y.o.   MRN: [id number] Nephrology Progress Note  S: sedated/nonresponsive  O:BP 95/64  Pulse 75  Temp(Src) 98.1 F (36.7 C) (Oral)  Resp 24  Ht 5\' 8"  (1.727 m)  Wt 250 lb 10.6 oz (113.7 kg)  BMI 38.12 kg/m2  SpO2 100%  Intake/Output Summary (Last 24 hours) at 11/24/13 0805 Last data filed at 11/24/13 0700  Gross per 24 hour  Intake 2153.93 ml  Output   1665 ml  Net 488.93 ml   Intake/Output: I/O last 3 completed shifts: In: 3578.4 [I.V.:1783.4; NG/GT:1320; IV Piggyback:475] Out: 3288 [Urine:175; Other:3113]  Intake/Output this shift:    Weight change: 1 lb 15.8 oz (0.9 kg) Gen: NAD sedated on vent CVS: RRR, 2/6 systolic murmur Resp:  vent, rhonchi throughout anteriorly Abd: soft, +BS obese Ext: 2+ edema LE Access: left IJ  Recent Labs Lab 11/24/2013 1843  11/21/13 0450 11/21/13 1521 11/22/13 0415 11/22/13 1600 11/23/13 0400 11/23/13 1600 11/24/13 0407  NA  --   < > 135* 133* 135* 133* 135* 135* 135*  K  --   < > 4.3 4.1 4.1 4.6 3.7 4.3 4.3  CL  --   < > 97 95* 94* 93* 96 98 98  CO2  --   < > 18* 19 20 23 23 25 25   GLUCOSE  --   < > 133* 128* 180* 202* 199* 166* 151*  BUN  --   < > 51* 64* 87* 92* 65* 53* 48*  CREATININE  --   < > 3.58* 4.18* 4.76* 4.18* 2.19* 1.63* 1.28*  ALBUMIN 2.9*  --   --  1.8* 1.7* 1.7* 1.6* 1.7* 1.7*  CALCIUM  --   < > 8.6 8.3* 8.2* 7.9* 7.7* 7.9* 8.1*  PHOS  --   < > 4.8* 5.3* 6.0* 6.6* 3.3 3.5 2.3  AST 16  --   --   --  295*  --  300*  --  296*  ALT 10  --   --   --  116*  --  145*  --  168*  < > = values in this interval not displayed. Liver Function Tests:  Recent Labs Lab 11/22/13 0415  11/23/13 0400 11/23/13 1600 11/24/13 0407  AST 295*  --  300*  --  296*  ALT 116*  --  145*  --  168*  ALKPHOS 98  --  131*  --  153*  BILITOT 0.7  --  0.5  --  0.5  PROT 7.9  --  7.8  --  8.0  ALBUMIN 1.7*  < > 1.6* 1.7* 1.7*  < > = values in this interval not displayed.  Recent  Labs Lab 11/06/2013 1843  LIPASE 12   No results found for this basename: AMMONIA,  in the last 168 hours CBC:  Recent Labs Lab 10/30/2013 1022  11/20/13 0617 11/21/13 0450 11/22/13 0415 11/23/13 0400 11/24/13 0407  WBC 8.6  < > 6.3 12.2* 20.3* 12.3* 19.6*  NEUTROABS 7.3  --  5.5  --   --   --  15.9*  HGB 10.0*  < > 10.0* 8.3* 7.9* 7.8* 7.9*  HCT 30.2*  < > 29.5* 24.2* 22.3* 21.8* 22.7*  MCV 87.0  < > 85.0 83.4 81.7 79.9 81.9  PLT 289  < > 307 191 163 129* 132*  < > = values in this interval not displayed. Cardiac Enzymes:  Recent Labs Lab 11/18/13 0134 11/18/13  1610 11/18/13 1845 11/19/13 0230 11/19/13 1641 11/19/13 2157 11/20/13 0334 11/20/13 0820  CKTOTAL  --  492* 482* 361*  --   --  200*  --   TROPONINI <0.30  --   --  <0.30 <0.30 <0.30  --  <0.30   CBG:  Recent Labs Lab 11/23/13 1154 11/23/13 1537 11/23/13 1927 11/24/13 0038 11/24/13 0359  GLUCAP 148* 141* 161* 139* 160*    Iron Studies: No results found for this basename: IRON, TIBC, TRANSFERRIN, FERRITIN,  in the last 72 hours Studies/Results: Dg Chest Port 1 View  11/24/2013   CLINICAL DATA:  38 year old female with history of acute respiratory failure.  EXAM: PORTABLE CHEST - 1 VIEW  COMPARISON:  Chest x-ray 11/22/2013.  FINDINGS: An endotracheal tube is in place with tip 4.4 cm above the carina. There is a left-sided internal jugular central venous catheter with tip terminating in the superior cavoatrial junction. Right internal jugular Vas-Cath with tip terminating in the mid superior vena cava. A nasogastric tube is seen extending into the stomach, however, the tip of the nasogastric tube extends below the lower margin of the image. Lung volumes are low. Extensive bibasilar opacities (right greater than left) may reflect areas of atelectasis and/or consolidation, with superimposed moderate bilateral pleural effusions (right greater than left). Some cephalization of the pulmonary vasculature and  indistinctness of interstitial markings is noted. Mild cardiomegaly is unchanged. Upper mediastinal contours are distorted by patient positioning.  IMPRESSION: 1. Support apparatus, as above. 2. Persistent low lung volumes with bibasilar opacities may reflect areas of atelectasis and/or consolidation, with superimposed bilateral moderate pleural effusions (right greater than left). 3. Given the presence of cardiomegaly, and some cephalization of the pulmonary vasculature, some component of underlying pulmonary edema is suspected.   Electronically Signed   By: Trudie Reed M.D.   On: 11/24/2013 06:55   Dg Chest Port 1 View  11/22/2013   CLINICAL DATA:  Status post placement of a right hemodialysis catheter.  EXAM: PORTABLE CHEST - 1 VIEW  COMPARISON:  11/22/2013 at 4:54 a.m.  FINDINGS: Right internal jugular central venous catheter has its tip in the lower superior vena cava just above the caval atrial junction. In lies adjacent to the tip of the left internal jugular central venous catheter, which is stable.  No pneumothorax.  Endotracheal tube tip projects 2.2 cm above the chronic. Nasogastric tube passes below the diaphragm into the stomach.  There are bilateral effusions with central vascular congestion and mild bilateral interstitial thickening. Cardiopericardial silhouette is mild to moderately enlarged. This is similar to the earlier exam allowing for differences in positioning and lung volumes.  IMPRESSION: 1. Hemodialysis catheter tip is well positioned in the lower superior vena cava. 2. No pneumothorax. 3. Persistent pleural effusions and interstitial thickening with hazy mid and lower lung zone opacity and cardiomegaly, findings consistent with congestive heart failure.   Electronically Signed   By: Amie Portland M.D.   On: 11/22/2013 13:33   . antiseptic oral rinse  7 mL Mouth Rinse QID  . cefTRIAXone (ROCEPHIN)  IV  2 g Intravenous Q24H  . chlorhexidine  15 mL Mouth Rinse BID  . feeding  supplement (PRO-STAT SUGAR FREE 64)  30 mL Oral TID WC  . feeding supplement (VITAL HIGH PROTEIN)  1,000 mL Per Tube Q24H  . hydrocortisone sod succinate (SOLU-CORTEF) inj  50 mg Intravenous Q8H  . insulin aspart  0-20 Units Subcutaneous 6 times per day  . insulin glargine  5 Units  Subcutaneous Daily  . metoCLOPramide (REGLAN) injection  5 mg Intravenous 3 times per day  . pantoprazole (PROTONIX) IV  40 mg Intravenous Daily    BMET    Component Value Date/Time   NA 135* 11/24/2013 0407   K 4.3 11/24/2013 0407   CL 98 11/24/2013 0407   CO2 25 11/24/2013 0407   GLUCOSE 151* 11/24/2013 0407   BUN 48* 11/24/2013 0407   CREATININE 1.28* 11/24/2013 0407   CALCIUM 8.1* 11/24/2013 0407   GFRNONAA 53* 11/24/2013 0407   GFRAA 61* 11/24/2013 0407   CBC    Component Value Date/Time   WBC 19.6* 11/24/2013 0407   RBC 2.77* 11/24/2013 0407   HGB 7.9* 11/24/2013 0407   HCT 22.7* 11/24/2013 0407   PLT 132* 11/24/2013 0407   MCV 81.9 11/24/2013 0407   MCH 28.5 11/24/2013 0407   MCHC 34.8 11/24/2013 0407   RDW 14.9 11/24/2013 0407   LYMPHSABS 1.0 11/24/2013 0407   MONOABS 2.5* 11/24/2013 0407   EOSABS 0.0 11/24/2013 0407   BASOSABS 0.2* 11/24/2013 0407    Assessment/Plan:  1. AKI oliguric, some urine now. Good solute clearance, acid/base/K with CRRT with fluid even. Most likely toxic with NSAIDs and contrast. Minimal UOP.  Will eval component of poss Autoimmune disease in future 2. Pneumonia/gram positive sepsis: on abx 3. VDRF per CCM  4. Anemia  5. Nutrition TF  6. ? Autoimmune disease Pos ANA, low C4 but C3 normal so not as likely SLE but will need bx once stable if not improving. Positive RF titer likely from strep sepsis. 7. Thrombocytopenia P cont CRRT, keep fluids even,mildly neg  Tawni CarnesWight, Andrew 8:05 AM I have seen and examined this patient and agree with the plan of care seen, eval, examined, and discussed with resident and staff. .  Alesana Magistro L 11/24/2013, 12:07  PM

## 2013-11-25 ENCOUNTER — Inpatient Hospital Stay (HOSPITAL_COMMUNITY): Payer: PRIVATE HEALTH INSURANCE

## 2013-11-25 DIAGNOSIS — J948 Other specified pleural conditions: Secondary | ICD-10-CM

## 2013-11-25 DIAGNOSIS — N170 Acute kidney failure with tubular necrosis: Secondary | ICD-10-CM

## 2013-11-25 LAB — CSF CELL COUNT WITH DIFFERENTIAL
RBC COUNT CSF: 2 /mm3 — AB
Tube #: 3
WBC CSF: 4 /mm3 (ref 0–5)

## 2013-11-25 LAB — COMPREHENSIVE METABOLIC PANEL
ALBUMIN: 1.8 g/dL — AB (ref 3.5–5.2)
ALT: 154 U/L — ABNORMAL HIGH (ref 0–35)
ANION GAP: 12 (ref 5–15)
AST: 178 U/L — ABNORMAL HIGH (ref 0–37)
Alkaline Phosphatase: 183 U/L — ABNORMAL HIGH (ref 39–117)
BUN: 39 mg/dL — ABNORMAL HIGH (ref 6–23)
CALCIUM: 7.9 mg/dL — AB (ref 8.4–10.5)
CO2: 24 meq/L (ref 19–32)
Chloride: 96 mEq/L (ref 96–112)
Creatinine, Ser: 0.97 mg/dL (ref 0.50–1.10)
GFR calc Af Amer: 85 mL/min — ABNORMAL LOW (ref >90)
GFR calc non Af Amer: 74 mL/min — ABNORMAL LOW (ref >90)
Glucose, Bld: 168 mg/dL — ABNORMAL HIGH (ref 70–99)
Potassium: 4.2 mEq/L (ref 3.7–5.3)
Sodium: 132 mEq/L — ABNORMAL LOW (ref 137–147)
Total Bilirubin: 0.7 mg/dL (ref 0.3–1.2)
Total Protein: 8.2 g/dL (ref 6.0–8.3)

## 2013-11-25 LAB — CBC
HCT: 24.9 % — ABNORMAL LOW (ref 36.0–46.0)
Hemoglobin: 8.8 g/dL — ABNORMAL LOW (ref 12.0–15.0)
MCH: 29.5 pg (ref 26.0–34.0)
MCHC: 35.3 g/dL (ref 30.0–36.0)
MCV: 83.6 fL (ref 78.0–100.0)
PLATELETS: 160 10*3/uL (ref 150–400)
RBC: 2.98 MIL/uL — ABNORMAL LOW (ref 3.87–5.11)
RDW: 15.6 % — ABNORMAL HIGH (ref 11.5–15.5)
WBC: 24.8 10*3/uL — ABNORMAL HIGH (ref 4.0–10.5)

## 2013-11-25 LAB — POCT I-STAT 3, ART BLOOD GAS (G3+)
Acid-Base Excess: 2 mmol/L (ref 0.0–2.0)
Bicarbonate: 26 mEq/L — ABNORMAL HIGH (ref 20.0–24.0)
O2 Saturation: 100 %
PH ART: 7.483 — AB (ref 7.350–7.450)
TCO2: 27 mmol/L (ref 0–100)
pCO2 arterial: 34.5 mmHg — ABNORMAL LOW (ref 35.0–45.0)
pO2, Arterial: 152 mmHg — ABNORMAL HIGH (ref 80.0–100.0)

## 2013-11-25 LAB — GLUCOSE, CAPILLARY
GLUCOSE-CAPILLARY: 136 mg/dL — AB (ref 70–99)
GLUCOSE-CAPILLARY: 157 mg/dL — AB (ref 70–99)
Glucose-Capillary: 131 mg/dL — ABNORMAL HIGH (ref 70–99)
Glucose-Capillary: 102 mg/dL — ABNORMAL HIGH (ref 70–99)
Glucose-Capillary: 146 mg/dL — ABNORMAL HIGH (ref 70–99)
Glucose-Capillary: 210 mg/dL — ABNORMAL HIGH (ref 70–99)

## 2013-11-25 LAB — RENAL FUNCTION PANEL
ANION GAP: 14 (ref 5–15)
Albumin: 1.8 g/dL — ABNORMAL LOW (ref 3.5–5.2)
BUN: 45 mg/dL — ABNORMAL HIGH (ref 6–23)
CALCIUM: 8.2 mg/dL — AB (ref 8.4–10.5)
CO2: 22 meq/L (ref 19–32)
Chloride: 97 mEq/L (ref 96–112)
Creatinine, Ser: 1.48 mg/dL — ABNORMAL HIGH (ref 0.50–1.10)
GFR, EST AFRICAN AMERICAN: 51 mL/min — AB (ref >90)
GFR, EST NON AFRICAN AMERICAN: 44 mL/min — AB (ref >90)
Glucose, Bld: 205 mg/dL — ABNORMAL HIGH (ref 70–99)
POTASSIUM: 5.2 meq/L (ref 3.7–5.3)
Phosphorus: 5.1 mg/dL — ABNORMAL HIGH (ref 2.3–4.6)
Sodium: 133 mEq/L — ABNORMAL LOW (ref 137–147)

## 2013-11-25 LAB — CLOSTRIDIUM DIFFICILE BY PCR: CDIFFPCR: NEGATIVE

## 2013-11-25 LAB — TRIGLYCERIDES: Triglycerides: 726 mg/dL — ABNORMAL HIGH (ref <150)

## 2013-11-25 LAB — GRAM STAIN

## 2013-11-25 LAB — APTT: APTT: 28 s (ref 24–37)

## 2013-11-25 LAB — PROTIME-INR
INR: 1.35 (ref 0.00–1.49)
PROTHROMBIN TIME: 16.9 s — AB (ref 11.6–15.2)

## 2013-11-25 LAB — MAGNESIUM: Magnesium: 2.5 mg/dL (ref 1.5–2.5)

## 2013-11-25 LAB — PROTEIN AND GLUCOSE, CSF
GLUCOSE CSF: 110 mg/dL — AB (ref 43–76)
Total  Protein, CSF: 19 mg/dL (ref 15–45)

## 2013-11-25 LAB — PHOSPHORUS: PHOSPHORUS: 2.9 mg/dL (ref 2.3–4.6)

## 2013-11-25 MED ORDER — EPINEPHRINE HCL 1 MG/ML IJ SOLN
INTRAMUSCULAR | Status: DC
Start: 2013-11-25 — End: 2013-11-25
  Filled 2013-11-25: qty 1

## 2013-11-25 MED ORDER — HYDROCORTISONE NA SUCCINATE PF 100 MG IJ SOLR
50.0000 mg | Freq: Four times a day (QID) | INTRAMUSCULAR | Status: DC
  Administered 2013-11-25 – 2013-11-26 (×4): 50 mg via INTRAVENOUS
  Filled 2013-11-25 (×8): qty 1

## 2013-11-25 MED ORDER — PANTOPRAZOLE SODIUM 40 MG PO PACK
40.0000 mg | PACK | Freq: Every day | ORAL | Status: DC
  Filled 2013-11-25: qty 20

## 2013-11-25 MED ORDER — MIDAZOLAM HCL 2 MG/2ML IJ SOLN
2.0000 mg | INTRAMUSCULAR | Status: DC | PRN
  Administered 2013-11-25: 2 mg via INTRAVENOUS

## 2013-11-25 MED ORDER — SODIUM CHLORIDE 0.9 % IV SOLN
0.0000 mg/h | INTRAVENOUS | Status: DC
  Administered 2013-11-25: 2 mg/h via INTRAVENOUS
  Administered 2013-11-25: 4 mg/h via INTRAVENOUS
  Filled 2013-11-25 (×3): qty 10

## 2013-11-25 MED ORDER — VECURONIUM BROMIDE 10 MG IV SOLR
6.0000 mg | Freq: Once | INTRAVENOUS | Status: AC
  Administered 2013-11-25: 6 mg via INTRAVENOUS

## 2013-11-25 MED ORDER — MIDAZOLAM HCL 2 MG/2ML IJ SOLN: 2.0000 mg | INTRAMUSCULAR | Status: DC | PRN

## 2013-11-25 MED ORDER — EPINEPHRINE HCL 0.1 MG/ML IJ SOSY
PREFILLED_SYRINGE | INTRAMUSCULAR | Status: AC
  Filled 2013-11-25: qty 10

## 2013-11-25 MED ORDER — MIDAZOLAM HCL 2 MG/2ML IJ SOLN
2.0000 mg | INTRAMUSCULAR | Status: DC | PRN
  Administered 2013-11-25: 2 mg via INTRAVENOUS
  Filled 2013-11-25 (×2): qty 2

## 2013-11-25 NOTE — Procedures (Signed)
Lumbar Puncture Procedure Note  Pre-operative Diagnosis: r/o resolving bact meningitis,r/o vasculitis If pos would change dosing of ceftriaxone, recognizing yield may be low for infection  Post-operative Diagnosis: r/o vasculitis, high pressure  Indications: Diagnostic  Procedure Details   Consent: Informed consent was obtained. Risks of the procedure were discussed including: infection, bleeding, pain and headache.  The patient was positioned under sterile conditions. Betadine solution and sterile drapes were utilized. A spinal needle was inserted at the L3 - L4 interspace.  Spinal fluid was obtained and sent to the laboratory.  Findings 9mL of clear spinal fluid was obtained. Opening Pressure: HIGH!!! 39 cm H2O pressure. Closing Pressure: 23 cm H2O pressure.  Complications:  None; patient tolerated the procedure well.        Condition: stable  Plan Bed rest for 5 hours.  coags wnl prior, ct neg  Mcarthur RossettiDaniel J. Tyson AliasFeinstein, MD, FACP Pgr: [phone] [facility]

## 2013-11-25 NOTE — Progress Notes (Signed)
Tachycardic and tachypneic- contacted Dr Tyson AliasFeinstein for Chest Xray and gas

## 2013-11-25 NOTE — Progress Notes (Signed)
Patient ID: Courtney Cortez, female    DOB: 04/02/1975, 38 y.o.   MRN: [id number] Nephrology Progress Note  S: sedated/nonresponsive  O:BP 106/69  Pulse 87  Temp(Src) 95.2 F (35.1 C) (Rectal)  Resp 18  Ht 5\' 8"  (1.727 m)  Wt 251 lb 12.3 oz (114.2 kg)  BMI 38.29 kg/m2  SpO2 100%  Intake/Output Summary (Last 24 hours) at 11/25/13 0801 Last data filed at 11/25/13 0700  Gross per 24 hour  Intake 2605.25 ml  Output   2694 ml  Net -88.75 ml   Intake/Output: I/O last 3 completed shifts: In: 3701.7 [I.V.:2226.7; NG/GT:1425; IV Piggyback:50] Out: 3798 [Urine:424; Other:3374]  Intake/Output this shift:    Weight change: 1 lb 1.6 oz (0.5 kg) Gen: NAD sedated on vent CVS: RRR, 2/6 systolic murmur Resp:  vent, rhonchi throughout anteriorly Abd: soft, +BS obese Ext: 2+ edema LE Access: left IJ  Recent Labs Lab 11/22/13 0415 11/22/13 1600 11/23/13 0400 11/23/13 1600 11/24/13 0407 11/24/13 1600 11/25/13 0426  NA 135* 133* 135* 135* 135* 137 132*  K 4.1 4.6 3.7 4.3 4.3 4.1 4.2  CL 94* 93* 96 98 98 101 96  CO2 20 23 23 25 25 25 24   GLUCOSE 180* 202* 199* 166* 151* 139* 168*  BUN 87* 92* 65* 53* 48* 45* 39*  CREATININE 4.76* 4.18* 2.19* 1.63* 1.28* 1.19* 0.97  ALBUMIN 1.7* 1.7* 1.6* 1.7* 1.7* 1.7* 1.8*  CALCIUM 8.2* 7.9* 7.7* 7.9* 8.1* 8.0* 7.9*  PHOS 6.0* 6.6* 3.3 3.5 2.3 2.3 2.9  AST 295*  --  300*  --  296*  --  178*  ALT 116*  --  145*  --  168*  --  154*   Liver Function Tests:  Recent Labs Lab 11/23/13 0400  11/24/13 0407 11/24/13 1600 11/25/13 0426  AST 300*  --  296*  --  178*  ALT 145*  --  168*  --  154*  ALKPHOS 131*  --  153*  --  183*  BILITOT 0.5  --  0.5  --  0.7  PROT 7.8  --  8.0  --  8.2  ALBUMIN 1.6*  < > 1.7* 1.7* 1.8*  < > = values in this interval not displayed. No results found for this basename: LIPASE, AMYLASE,  in the last 168 hours No results found for this basename: AMMONIA,  in the last 168 hours CBC:  Recent Labs Lab 11/20/13 0617  11/21/13 0450 11/22/13 0415 11/23/13 0400 11/24/13 0407 11/25/13 0426  WBC 6.3 12.2* 20.3* 12.3* 19.6* 24.8*  NEUTROABS 5.5  --   --   --  15.9*  --   HGB 10.0* 8.3* 7.9* 7.8* 7.9* 8.8*  HCT 29.5* 24.2* 22.3* 21.8* 22.7* 24.9*  MCV 85.0 83.4 81.7 79.9 81.9 83.6  PLT 307 191 163 129* 132* 160   Cardiac Enzymes:  Recent Labs Lab 11/18/13 1845 11/19/13 0230 11/19/13 1641 11/19/13 2157 11/20/13 0334 11/20/13 0820  CKTOTAL 482* 361*  --   --  200*  --   TROPONINI  --  <0.30 <0.30 <0.30  --  <0.30   CBG:  Recent Labs Lab 11/24/13 1146 11/24/13 1505 11/24/13 2047 11/25/13 0043 11/25/13 0424  GLUCAP 153* 141* 151* 136* 131*    Iron Studies: No results found for this basename: IRON, TIBC, TRANSFERRIN, FERRITIN,  in the last 72 hours Studies/Results: Ct Head Wo Contrast  11/24/2013   CLINICAL DATA:  Altered mental status  EXAM: CT HEAD WITHOUT CONTRAST  TECHNIQUE: Contiguous axial  images were obtained from the base of the skull through the vertex without intravenous contrast.  COMPARISON:  None.  FINDINGS: No evidence of parenchymal hemorrhage or extra-axial fluid collection. No mass lesion, mass effect, or midline shift.  No CT evidence of acute infarction.  Cerebral volume is within normal limits.  No ventriculomegaly.  Partial opacification of the bilateral maxillary sinuses. Near complete opacification of the bilateral frontal and ethmoid sinuses. Mastoid air cells are clear.  Calcifications of the bilateral parotid glands.  No evidence of calvarial fracture.  IMPRESSION: No evidence of acute intracranial abnormality.  Paranasal sinus disease, as above.   Electronically Signed   By: Charline BillsSriyesh  Krishnan M.D.   On: 11/24/2013 18:18   [place] Chest  11/24/2013   CLINICAL DATA:  Respiratory failure. Pleural effusions noted on chest x-ray.  EXAM: CHEST ULTRASOUND  COMPARISON:  None.  FINDINGS: Ultrasound of the right chest demonstrates small effusion, however this is severely loculated  with innumerable dense septations. There is essentially no free fluid. Thoracentesis was not performed.  IMPRESSION: Severely loculated small to moderate sized right effusion.  Thoracentesis not performed  Read by: Brayton ElKevin Bruning PA-C   Electronically Signed   By: Gilmer MorJaime  Wagner D.O.   On: 11/24/2013 14:40   Portable Chest Xray  11/25/2013   CLINICAL DATA:  Acute respiratory failure.  EXAM: PORTABLE CHEST - 1 VIEW  COMPARISON:  11/24/2013  FINDINGS: Interval placement of left central line. This her rolls in the right mediastinal and possibly is in the azygos vein. No pneumothorax. Right central line endotracheal tube per stable. NG tube coils in the stomach.  Cardiomegaly. Bilateral pleural effusions, right greater than left. The right effusion has enlarged since prior study. Bilateral perihilar and lower lobe opacities could reflect edema, atelectasis or infection and of slightly progressed.  IMPRESSION: Of bilateral effusions, enlarging on the right since prior study. Slight increase in bilateral perihilar and lower lobe airspace opacities.  Newly placed left central line curls in the mediastinum, possibly within the azygous vein.   Electronically Signed   By: Charlett NoseKevin  Dover M.D.   On: 11/25/2013 07:28   Dg Chest Port 1 View  11/24/2013   CLINICAL DATA:  Encounter for intubation.  EXAM: PORTABLE CHEST - 1 VIEW  COMPARISON:  One-view chest from the same day at 4:51 a.m.  FINDINGS: The endotracheal tube has been advanced and now terminates just 1.7 cm above the carina. The NG tube courses off the inferior border of film. A right IJ sheath is in place. A left IJ line is stable. The heart is enlarged. Edema and bilateral pleural effusions are unchanged. Right greater than left basilar airspace disease is again noted.  IMPRESSION: 1. The endotracheal tube now terminates 1.7 cm above the carina and could be pulled back 1-2 cm for more optimal positioning. 2. Support apparatus is otherwise stable. 3. Persistent  cardiomegaly with bilateral effusions and edema suggesting congestive heart failure. 4. Right greater than left basilar airspace opacities. While this may represent atelectasis, infection is also considered. These results will be called to the ordering clinician or representative by the Radiologist Assistant, and communication documented in the PACS or zVision Dashboard.   Electronically Signed   By: Gennette Pachris  Mattern M.D.   On: 11/24/2013 12:21   Dg Chest Port 1 View  11/24/2013   CLINICAL DATA:  38 year old female with history of acute respiratory failure.  EXAM: PORTABLE CHEST - 1 VIEW  COMPARISON:  Chest x-ray 11/22/2013.  FINDINGS: An endotracheal tube  is in place with tip 4.4 cm above the carina. There is a left-sided internal jugular central venous catheter with tip terminating in the superior cavoatrial junction. Right internal jugular Vas-Cath with tip terminating in the mid superior vena cava. A nasogastric tube is seen extending into the stomach, however, the tip of the nasogastric tube extends below the lower margin of the image. Lung volumes are low. Extensive bibasilar opacities (right greater than left) may reflect areas of atelectasis and/or consolidation, with superimposed moderate bilateral pleural effusions (right greater than left). Some cephalization of the pulmonary vasculature and indistinctness of interstitial markings is noted. Mild cardiomegaly is unchanged. Upper mediastinal contours are distorted by patient positioning.  IMPRESSION: 1. Support apparatus, as above. 2. Persistent low lung volumes with bibasilar opacities may reflect areas of atelectasis and/or consolidation, with superimposed bilateral moderate pleural effusions (right greater than left). 3. Given the presence of cardiomegaly, and some cephalization of the pulmonary vasculature, some component of underlying pulmonary edema is suspected.   Electronically Signed   By: Trudie Reed M.D.   On: 11/24/2013 06:55   .  antiseptic oral rinse  7 mL Mouth Rinse QID  . cefTRIAXone (ROCEPHIN)  IV  2 g Intravenous Q24H  . chlorhexidine  15 mL Mouth Rinse BID  . feeding supplement (PRO-STAT SUGAR FREE 64)  30 mL Oral TID WC  . feeding supplement (VITAL HIGH PROTEIN)  1,000 mL Per Tube Q24H  . hydrocortisone sod succinate (SOLU-CORTEF) inj  50 mg Intravenous Q12H  . insulin aspart  0-20 Units Subcutaneous 6 times per day  . insulin glargine  5 Units Subcutaneous Daily  . pantoprazole (PROTONIX) IV  40 mg Intravenous Daily    BMET    Component Value Date/Time   NA 132* 11/25/2013 0426   K 4.2 11/25/2013 0426   CL 96 11/25/2013 0426   CO2 24 11/25/2013 0426   GLUCOSE 168* 11/25/2013 0426   BUN 39* 11/25/2013 0426   CREATININE 0.97 11/25/2013 0426   CALCIUM 7.9* 11/25/2013 0426   GFRNONAA 74* 11/25/2013 0426   GFRAA 85* 11/25/2013 0426   CBC    Component Value Date/Time   WBC 24.8* 11/25/2013 0426   RBC 2.98* 11/25/2013 0426   HGB 8.8* 11/25/2013 0426   HCT 24.9* 11/25/2013 0426   PLT 160 11/25/2013 0426   MCV 83.6 11/25/2013 0426   MCH 29.5 11/25/2013 0426   MCHC 35.3 11/25/2013 0426   RDW 15.6* 11/25/2013 0426   LYMPHSABS 1.0 11/24/2013 0407   MONOABS 2.5* 11/24/2013 0407   EOSABS 0.0 11/24/2013 0407   BASOSABS 0.2* 11/24/2013 0407    Assessment/Plan:  1. AKI oliguric, some urine now. Good solute clearance, acid/base/K with CRRT with fluid even. Most likely toxic with NSAIDs and contrast. Minimal UOP.  Will eval component of poss Autoimmune disease in future 2. Pneumonia/gram positive sepsis: on abx 3. VDRF per CCM, reintubated 10/30 4. Hypothermia: cont bare hugger, suspect from CRRT but also septic 5. Anemia  6. Nutrition TF  7. ? Autoimmune disease Pos ANA but neg dsDNA, low C4 but C3 normal so not as likely SLE but will need bx once stable if not improving. Positive RF titer likely from strep sepsis. 8. Thrombocytopenia P cont CRRT, keep fluids even, mildly neg Cont AB, TF  Tawni Carnes 8:01 AM I have seen and examined this patient and agree with the plan of care seen, eval, examined, and discussed with staff, residents. .  Kasey Ewings L 11/25/2013, 1:28 PM

## 2013-11-25 NOTE — Progress Notes (Addendum)
PULMONARY  / CRITICAL CARE MEDICINE CONSULTATION   Name: Courtney Cortez MRN: [id number] DOB: 01/06/1976    ADMISSION DATE:  11/12/2013 CONSULTATION DATE: 11/20/2013  REQUESTING CLINICIAN: Dr. Daryll Drown PRIMARY SERVICE: FM  CHIEF COMPLAINT:  SOB/CP  BRIEF PATIENT DESCRIPTION: 38 F with arthritis who presented to [facility] on 10/22 with CP and SOB thought to be 2/2 pericarditis. Developed BL pleural effusions & Markedly tachypnic and tachycardic on 10/25 and PCCM asked to evaluate.  Blood cultures subsequently showed pneumococcus  SIGNIFICANT EVENTS  10/23 Admitted 10/25 Intubated 10/27 CRRT 10/29 Extubated and reintubated due to encephalopathy  STUDIES:  10/21 PE protocol CT >> No PE but bilateral "atelectasis" 10/22 PE Protocol CT >> No PE but worsened bilateral basilar disease 10/22 Echo >> Trivial pericardial effusion, No evidence of tamponade 10/23 HIV negative 10/24 Echo >> Trivial pericardial effusion, No evidence of tamponade 10/25 >> ABG 7.315/32.8/59.4 10/25 ESR 130 10/26 Renal U/S >> Increased echogenicity, no hydronephrosis 10/26 Bl effusions but too small to tap 10/27 CXR >> Worsening effusions CK 492 --> 200  LINES / TUBES: LIJ 10/25 >> ETT 10/25 >> RIJ HD cath 10/27 >>  CULTURES: Blood Culture x 2 10/25 >> pneumococcus Urine Culture 10/25>> neg Sputum Culture 10/25>> S. pneumoniae Resp Viral Panel 10/25>>  +rhinovirus Strep pneumo urinary Ag >> POSITIVE  ANTIBIOTICS: CTX 10/25 >>  (x14 days) Azithro 10/25-10/28  SUBJECTIVE: Hypothermic overnight, poor UOP, reintubated yesterday for encephalopathy  VITAL SIGNS: Temp:  [94 F (34.4 C)-100.2 F (37.9 C)] 95.2 F (35.1 C) (10/30 0254) Pulse Rate:  [32-114] 81 (10/30 0715) Resp:  [13-45] 19 (10/30 0730) BP: (68-148)/(39-99) 101/63 mmHg (10/30 0730) SpO2:  [67 %-100 %] 100 % (10/30 0715) FiO2 (%):  [30 %-50 %] 40 % (10/30 0400) Weight:  [251 lb 12.3 oz (114.2 kg)] 251 lb 12.3 oz (114.2 kg) (10/30  0500) HEMODYNAMICS:   VENTILATOR SETTINGS: Vent Mode:  [-] PRVC FiO2 (%):  [30 %-50 %] 40 % Set Rate:  [15 bmp-24 bmp] 15 bmp Vt Set:  [510 mL] 510 mL PEEP:  [5 cmH20] 5 cmH20 Pressure Support:  [5 cmH20] 5 cmH20 Plateau Pressure:  [12 cmH20-26 cmH20] 20 cmH20 INTAKE / OUTPUT: Intake/Output     10/29 0701 - 10/30 0700 10/30 0701 - 10/31 0700   I.V. (mL/kg) 1698 (14.9)    NG/GT 945    IV Piggyback 50    Total Intake(mL/kg) 2693 (23.6)    Urine (mL/kg/hr) 364 (0.1)    Other 2475 (0.9)    Total Output 2839     Net -146.1            PHYSICAL EXAMINATION: General:  Obese F intubated, sedated but responds to pain, underneath warmer and covers Neuro:  RASS -2, WUA with agitation HEENT:  Sclera anicteric, conjunctiva pink, MMM, OP clear  Neck: Trachea supple and midline, no JVD; L IJ and R HD cath  Cardiovascular:  RRR, NS1/S2, II/VI holosystolic murmur Lungs: Coarse, diminished at bases. Abdomen:  S/NT/ND/(+)BS Musculoskeletal: No deformities; 1+ pitting edema bilaterally Skin:  Intact, stable purpuric rash over BLEs  LABS:  CBC  Recent Labs Lab 11/23/13 0400 11/24/13 0407 11/25/13 0426  WBC 12.3* 19.6* 24.8*  HGB 7.8* 7.9* 8.8*  HCT 21.8* 22.7* 24.9*  PLT 129* 132* 160   Coag's  Recent Labs Lab 11/23/13 0400 11/24/13 0407  APTT 37 50*   BMET  Recent Labs Lab 11/24/13 0407 11/24/13 1600 11/25/13 0426  NA 135* 137 132*  K 4.3 4.1 4.2  CL 98 101 96  CO2 25 25 24   BUN 48* 45* 39*  CREATININE 1.28* 1.19* 0.97  GLUCOSE 151* 139* 168*   Electrolytes  Recent Labs Lab 11/23/13 0400  11/24/13 0407 11/24/13 1600 11/25/13 0426  CALCIUM 7.7*  < > 8.1* 8.0* 7.9*  MG 2.6*  --  2.5  --  2.5  PHOS 3.3  < > 2.3 2.3 2.9  < > = values in this interval not displayed. Sepsis Markers  Recent Labs Lab 11/20/13 0820 11/20/13 1352 11/20/13 1846  LATICACIDVEN 5.3* 4.4* 2.8*   ABG  Recent Labs Lab 11/22/13 0220 11/23/13 1014 11/24/13 2213  PHART  7.304* 7.358 7.363  PCO2ART 38.6 44.0 42.5  PO2ART 77.6* 56.0* 122.0*   Liver Enzymes  Recent Labs Lab 11/23/13 0400  11/24/13 0407 11/24/13 1600 11/25/13 0426  AST 300*  --  296*  --  178*  ALT 145*  --  168*  --  154*  ALKPHOS 131*  --  153*  --  183*  BILITOT 0.5  --  0.5  --  0.7  ALBUMIN 1.6*  < > 1.7* 1.7* 1.8*  < > = values in this interval not displayed. Cardiac Enzymes  Recent Labs Lab 11/19/13 1641 11/19/13 2157 11/20/13 0820  TROPONINI <0.30 <0.30 <0.30  PROBNP  --   --  6391.0*   Glucose  Recent Labs Lab 11/24/13 0810 11/24/13 1146 11/24/13 1505 11/24/13 2047 11/25/13 0043 11/25/13 0424  GLUCAP 136* 153* 141* 151* 136* 131*    Imaging Ct Head Wo Contrast  11/24/2013   CLINICAL DATA:  Altered mental status  EXAM: CT HEAD WITHOUT CONTRAST  TECHNIQUE: Contiguous axial images were obtained from the base of the skull through the vertex without intravenous contrast.  COMPARISON:  None.  FINDINGS: No evidence of parenchymal hemorrhage or extra-axial fluid collection. No mass lesion, mass effect, or midline shift.  No CT evidence of acute infarction.  Cerebral volume is within normal limits.  No ventriculomegaly.  Partial opacification of the bilateral maxillary sinuses. Near complete opacification of the bilateral frontal and ethmoid sinuses. Mastoid air cells are clear.  Calcifications of the bilateral parotid glands.  No evidence of calvarial fracture.  IMPRESSION: No evidence of acute intracranial abnormality.  Paranasal sinus disease, as above.   Electronically Signed   By: Julian Hy M.D.   On: 11/24/2013 18:18   [place] Chest  11/24/2013   CLINICAL DATA:  Respiratory failure. Pleural effusions noted on chest x-ray.  EXAM: CHEST ULTRASOUND  COMPARISON:  None.  FINDINGS: Ultrasound of the right chest demonstrates small effusion, however this is severely loculated with innumerable dense septations. There is essentially no free fluid. Thoracentesis was not  performed.  IMPRESSION: Severely loculated small to moderate sized right effusion.  Thoracentesis not performed  Read by: Ascencion Dike PA-C   Electronically Signed   By: Corrie Mckusick D.O.   On: 11/24/2013 14:40   Portable Chest Xray  11/25/2013   CLINICAL DATA:  Acute respiratory failure.  EXAM: PORTABLE CHEST - 1 VIEW  COMPARISON:  11/24/2013  FINDINGS: Interval placement of left central line. This her rolls in the right mediastinal and possibly is in the azygos vein. No pneumothorax. Right central line endotracheal tube per stable. NG tube coils in the stomach.  Cardiomegaly. Bilateral pleural effusions, right greater than left. The right effusion has enlarged since prior study. Bilateral perihilar and lower lobe opacities could reflect edema, atelectasis or infection and of slightly progressed.  IMPRESSION: Of bilateral  effusions, enlarging on the right since prior study. Slight increase in bilateral perihilar and lower lobe airspace opacities.  Newly placed left central line curls in the mediastinum, possibly within the azygous vein.   Electronically Signed   By: Rolm Baptise M.D.   On: 11/25/2013 07:28   Dg Chest Port 1 View  11/24/2013   CLINICAL DATA:  Encounter for intubation.  EXAM: PORTABLE CHEST - 1 VIEW  COMPARISON:  One-view chest from the same day at 4:51 a.m.  FINDINGS: The endotracheal tube has been advanced and now terminates just 1.7 cm above the carina. The NG tube courses off the inferior border of film. A right IJ sheath is in place. A left IJ line is stable. The heart is enlarged. Edema and bilateral pleural effusions are unchanged. Right greater than left basilar airspace disease is again noted.  IMPRESSION: 1. The endotracheal tube now terminates 1.7 cm above the carina and could be pulled back 1-2 cm for more optimal positioning. 2. Support apparatus is otherwise stable. 3. Persistent cardiomegaly with bilateral effusions and edema suggesting congestive heart failure. 4. Right  greater than left basilar airspace opacities. While this may represent atelectasis, infection is also considered. These results will be called to the ordering clinician or representative by the Radiologist Assistant, and communication documented in the PACS or zVision Dashboard.   Electronically Signed   By: Lawrence Santiago M.D.   On: 11/24/2013 12:21   Dg Chest Port 1 View  11/24/2013   CLINICAL DATA:  38 year old female with history of acute respiratory failure.  EXAM: PORTABLE CHEST - 1 VIEW  COMPARISON:  Chest x-ray 11/22/2013.  FINDINGS: An endotracheal tube is in place with tip 4.4 cm above the carina. There is a left-sided internal jugular central venous catheter with tip terminating in the superior cavoatrial junction. Right internal jugular Vas-Cath with tip terminating in the mid superior vena cava. A nasogastric tube is seen extending into the stomach, however, the tip of the nasogastric tube extends below the lower margin of the image. Lung volumes are low. Extensive bibasilar opacities (right greater than left) may reflect areas of atelectasis and/or consolidation, with superimposed moderate bilateral pleural effusions (right greater than left). Some cephalization of the pulmonary vasculature and indistinctness of interstitial markings is noted. Mild cardiomegaly is unchanged. Upper mediastinal contours are distorted by patient positioning.  IMPRESSION: 1. Support apparatus, as above. 2. Persistent low lung volumes with bibasilar opacities may reflect areas of atelectasis and/or consolidation, with superimposed bilateral moderate pleural effusions (right greater than left). 3. Given the presence of cardiomegaly, and some cephalization of the pulmonary vasculature, some component of underlying pulmonary edema is suspected.   Electronically Signed   By: Vinnie Langton M.D.   On: 11/24/2013 06:55    EKG: Sinus Tach ,diffuse ST elevation, PR interval shortening CXR: 10/25 Bl effusions, left appears  loculated  ASSESSMENT / PLAN:  PULMONARY ETT 10/22 >> 10/29  ETT 10/29 >>  A: Acute hypoxic respiratory failure: Favor pneumococcal sepsis DDx included autoimmune pneumonitis/pleuritis. ? If there is an autoimmune process underlying all of her issues. Doubt aspiration Pleural effusions P:   SBT - Reintubated due to encephalopathy 10/29.  Treat neurostatus SBT today, cpap 5 ps 5, goal 1 hr, no extubation planned until neurostatus improved  CARDIOVASCULAR A: Pleuritic Chest Pain 2/2 presumed pericarditis: nml Tp Septic shock 2/2 pneumococcal bacteremia P:  Continues to require pressors Stress-dose steroids remain until off pressors This am pressors are improving Echo reviewed Neo to  map 60 Dc propofol  RENAL A: ARF: oliguric - ?ATN from contrast/ NSAIDs, hypotension ? presence of unifying autoimmune process, bland urinary sediment, low FENa AG Metabolic acidosis with respiratory compensation Pos RF, ANA (1:640), ESR high, pos ds-dna -favor SLE, complement low normal P:   CRRT with good solute clearance; electrolytes improving, marginal BP with negative balance UOP still poor Daily renal function panel Continue steroids - will need rheumatology as outpatient May require renal biopsy in future  GASTROINTESTINAL A: GERD Probable gastroparesis  P:   Cont PPI TFs @ 40cc/hr  Reglan , limit when able with female  - dc Bm  noted  HEMATOLOGIC A: Anemia: chronic disease, high ferritin hemoconcentration noted 10/30 P:   Cbc with diff in am Follow plat trend HITT sent, low suspicion   INFECTIOUS A: Pneumococcal sepsis Pneumococcal pneumonia with loculated pleural effusions Rhinovirus infection R/o underlying compliment deficiency at baseline P:   ceftx x 14 ds total IR refused thoracentesis 10/29, follow pcxr and clinical source, if pressors remain and fevers start will consider diagnostic thor at bedside only by PCCM with [place] Follow pressor needs At risk [place]  triad (rare), may need LP and TEE, see neuro  ENDOCRINE A: Hyperglycemia:  P:   Controlled with resistant SSI, titrate down with steroid taper Solu-cortef 65m q8h Assess total CH50 compliment  NEUROLOGIC A:  Sedation requirement Acute encephalopathy not resolving At risk rare [place] triad R./o lupus ceribritis P: Fent and propofol gtt, RASS goal -1 Repeat tg Consider brain imaging - needs MRI today, r/o cerebritis Add CH50 Keep steroids , in setting bacteremia at current dose, however, may need increase given activioty  Family updated 10/27, 10/30  SUMMARY: Sepsis ongoing, encephalopathy caused reintubation yesterday. May need brain imaging if continues. Continuing CRRT, hopeful for renal recovery. Will need rheumatology as outpatient and possible renal biopsy.   Ryan B. GBonner Puna MD, PGY-2 11/25/2013 7:47 AM  STAFF NOTE: I, Dr DClide Daleshave personally reviewed patient's available data, including medical history, events of note, physical examination and test results as part of my evaluation. I have discussed with resident/NP and other care providers such as pharmacist, RN and RRT. In addition,. Rest per NP/medical resident whose note is outlined above and that I agree with and edited in full.  I have elicited history, performed exam and plan, reintubated, poor neurostatus, MRI brain needed to r/o ;upuis cerebritis, weaning, no extub planned, cvvhd on going, may need lp. Family updated by ME The patient is critically ill with multiple organ systems failure and requires high complexity decision making for assessment and support, frequent evaluation and titration of therapies, application of advanced monitoring technologies and extensive interpretation of multiple databases.  Critical Care Time devoted to patient care services described in this note is 30 Minutes. This time reflects time of care of this signee Dr DMerrie Roof This critical care time does not reflect procedure time,  or teaching time or supervisory time of PA/NP/Med student/Med Resident etc but could involve care discussion time  Dr. DDrucie Opitz MD, FACP  Pulmonary and CSedgwickPulmonary and Critical Care  Pager [phone]

## 2013-11-25 NOTE — Progress Notes (Signed)
Transported pt to and from MRI on ventilator. Pt stable throughout. No Complications.

## 2013-11-26 ENCOUNTER — Inpatient Hospital Stay (HOSPITAL_COMMUNITY): Payer: PRIVATE HEALTH INSURANCE

## 2013-11-26 LAB — COMPREHENSIVE METABOLIC PANEL
ALT: 135 U/L — AB (ref 0–35)
AST: 97 U/L — AB (ref 0–37)
Albumin: 1.8 g/dL — ABNORMAL LOW (ref 3.5–5.2)
Alkaline Phosphatase: 190 U/L — ABNORMAL HIGH (ref 39–117)
Anion gap: 14 (ref 5–15)
BUN: 43 mg/dL — ABNORMAL HIGH (ref 6–23)
CALCIUM: 7.9 mg/dL — AB (ref 8.4–10.5)
CO2: 22 meq/L (ref 19–32)
Chloride: 100 mEq/L (ref 96–112)
Creatinine, Ser: 1.42 mg/dL — ABNORMAL HIGH (ref 0.50–1.10)
GFR calc Af Amer: 54 mL/min — ABNORMAL LOW (ref >90)
GFR, EST NON AFRICAN AMERICAN: 46 mL/min — AB (ref >90)
Glucose, Bld: 191 mg/dL — ABNORMAL HIGH (ref 70–99)
POTASSIUM: 5.6 meq/L — AB (ref 3.7–5.3)
Sodium: 136 mEq/L — ABNORMAL LOW (ref 137–147)
Total Bilirubin: 0.9 mg/dL (ref 0.3–1.2)
Total Protein: 8.6 g/dL — ABNORMAL HIGH (ref 6.0–8.3)

## 2013-11-26 LAB — CBC WITH DIFFERENTIAL/PLATELET
BASOS ABS: 0.3 10*3/uL — AB (ref 0.0–0.1)
BASOS PCT: 1 % (ref 0–1)
Band Neutrophils: 0 % (ref 0–10)
Blasts: 0 %
EOS ABS: 0.3 10*3/uL (ref 0.0–0.7)
EOS PCT: 1 % (ref 0–5)
HCT: 24.2 % — ABNORMAL LOW (ref 36.0–46.0)
HEMOGLOBIN: 8.1 g/dL — AB (ref 12.0–15.0)
Lymphocytes Relative: 6 % — ABNORMAL LOW (ref 12–46)
Lymphs Abs: 1.5 10*3/uL (ref 0.7–4.0)
MCH: 28.5 pg (ref 26.0–34.0)
MCHC: 33.5 g/dL (ref 30.0–36.0)
MCV: 85.2 fL (ref 78.0–100.0)
MYELOCYTES: 0 %
Metamyelocytes Relative: 0 %
Monocytes Absolute: 0.5 10*3/uL (ref 0.1–1.0)
Monocytes Relative: 2 % — ABNORMAL LOW (ref 3–12)
Neutro Abs: 22.7 10*3/uL — ABNORMAL HIGH (ref 1.7–7.7)
Neutrophils Relative %: 90 % — ABNORMAL HIGH (ref 43–77)
PLATELETS: 167 10*3/uL (ref 150–400)
Promyelocytes Absolute: 0 %
RBC: 2.84 MIL/uL — AB (ref 3.87–5.11)
RDW: 16 % — ABNORMAL HIGH (ref 11.5–15.5)
WBC: 25.3 10*3/uL — ABNORMAL HIGH (ref 4.0–10.5)
nRBC: 0 /100 WBC

## 2013-11-26 LAB — BLOOD GAS, ARTERIAL
ACID-BASE DEFICIT: 5.7 mmol/L — AB (ref 0.0–2.0)
Bicarbonate: 20.3 mEq/L (ref 20.0–24.0)
Drawn by: 31101
FIO2: 0.5 %
MECHVT: 510 mL
O2 Saturation: 98 %
PCO2 ART: 47.4 mmHg — AB (ref 35.0–45.0)
PEEP: 5 cmH2O
PO2 ART: 155 mmHg — AB (ref 80.0–100.0)
Patient temperature: 98.6
RATE: 15 resp/min
TCO2: 21.7 mmol/L (ref 0–100)
pH, Arterial: 7.254 — ABNORMAL LOW (ref 7.350–7.450)

## 2013-11-26 LAB — HEPARIN INDUCED THROMBOCYTOPENIA PNL
HEPARIN INDUCED PLT AB: NEGATIVE
PATIENT O. D.: 0.029
UFH HIGH DOSE UFH H: 0 %
UFH LOW DOSE 0.5 IU/ML: 0 %
UFH Low Dose 0.1 IU/mL: 0 % Release
UFH SRA RESULT: NEGATIVE

## 2013-11-26 LAB — PHOSPHORUS: PHOSPHORUS: 5.5 mg/dL — AB (ref 2.3–4.6)

## 2013-11-26 LAB — GLUCOSE, CAPILLARY
GLUCOSE-CAPILLARY: 110 mg/dL — AB (ref 70–99)
Glucose-Capillary: 164 mg/dL — ABNORMAL HIGH (ref 70–99)
Glucose-Capillary: 181 mg/dL — ABNORMAL HIGH (ref 70–99)

## 2013-11-26 LAB — APTT: aPTT: 54 seconds — ABNORMAL HIGH (ref 24–37)

## 2013-11-26 LAB — MAGNESIUM: Magnesium: 2.8 mg/dL — ABNORMAL HIGH (ref 1.5–2.5)

## 2013-11-26 MED ORDER — DEXTROSE 5 % IV SOLN
INTRAVENOUS | Status: DC
  Filled 2013-11-26 (×3): qty 150

## 2013-11-26 MED ORDER — SODIUM BICARBONATE 8.4 % IV SOLN
INTRAVENOUS | Status: AC
  Administered 2013-11-26: 11:00:00
  Filled 2013-11-26: qty 150

## 2013-11-26 MED ORDER — TENECTEPLASE 50 MG IV KIT
50.0000 mg | PACK | Freq: Once | INTRAVENOUS | Status: AC
  Administered 2013-11-26: 50 mg via INTRAVENOUS

## 2013-11-27 NOTE — Progress Notes (Signed)
Neo restarted, blood pressure dropped to 73/50 MAP 56.  Corliss SkainsJuan Jaslin Novitski RN

## 2013-11-27 NOTE — Progress Notes (Signed)
Patient ID: Courtney Cortez, female    DOB: Apr 24, 1975, 38 y.o.   MRN: [id number] Nephrology Progress Note  S: code blue underway  O:BP 93/61  Pulse 117  Temp(Src) 97.2 F (36.2 C) (Axillary)  Resp 24  Ht 5\' 8"  (1.727 m)  Wt 246 lb 7.6 oz (111.8 kg)  BMI 37.48 kg/m2  SpO2 95%  Intake/Output Summary (Last 24 hours) at 11/12/2013 1018 Last data filed at 11/10/2013 0700  Gross per 24 hour  Intake 1952.94 ml  Output   1952 ml  Net   0.94 ml   Intake/Output: I/O last 3 completed shifts: In: 4088.5 [I.V.:2303.5; NG/GT:1735; IV Piggyback:50] Out: 4098 [Urine:1119; Other:3333]  Intake/Output this shift:    Weight change: -5 lb 4.7 oz (-2.4 kg) Exam by Dr. Darrick Penna before code blue during rounding Gen: NAD sedated on vent CVS: RRR, 2/6 systolic murmur Resp:  vent, rhonchi throughout anteriorly Abd: soft, +BS obese Ext: 2+ edema LE Access: left IJ  Recent Labs Lab 11/22/13 0415  11/23/13 0400 11/23/13 1600 11/24/13 0407 11/24/13 1600 11/25/13 0426 11/25/13 1709 11/05/2013 0415  NA 135*  < > 135* 135* 135* 137 132* 133* 136*  K 4.1  < > 3.7 4.3 4.3 4.1 4.2 5.2 5.6*  CL 94*  < > 96 98 98 101 96 97 100  CO2 20  < > 23 25 25 25 24 22 22   GLUCOSE 180*  < > 199* 166* 151* 139* 168* 205* 191*  BUN 87*  < > 65* 53* 48* 45* 39* 45* 43*  CREATININE 4.76*  < > 2.19* 1.63* 1.28* 1.19* 0.97 1.48* 1.42*  ALBUMIN 1.7*  < > 1.6* 1.7* 1.7* 1.7* 1.8* 1.8* 1.8*  CALCIUM 8.2*  < > 7.7* 7.9* 8.1* 8.0* 7.9* 8.2* 7.9*  PHOS 6.0*  < > 3.3 3.5 2.3 2.3 2.9 5.1* 5.5*  AST 295*  --  300*  --  296*  --  178*  --  97*  ALT 116*  --  145*  --  168*  --  154*  --  135*  < > = values in this interval not displayed. Liver Function Tests:  Recent Labs Lab 11/24/13 0407  11/25/13 0426 11/25/13 1709 11/06/2013 0415  AST 296*  --  178*  --  97*  ALT 168*  --  154*  --  135*  ALKPHOS 153*  --  183*  --  190*  BILITOT 0.5  --  0.7  --  0.9  PROT 8.0  --  8.2  --  8.6*  ALBUMIN 1.7*  < > 1.8* 1.8* 1.8*  <  > = values in this interval not displayed. No results found for this basename: LIPASE, AMYLASE,  in the last 168 hours No results found for this basename: AMMONIA,  in the last 168 hours CBC:  Recent Labs Lab 11/20/13 0617  11/22/13 0415 11/23/13 0400 11/24/13 0407 11/25/13 0426 11/12/2013 0415  WBC 6.3  < > 20.3* 12.3* 19.6* 24.8* 25.3*  NEUTROABS 5.5  --   --   --  15.9*  --  22.7*  HGB 10.0*  < > 7.9* 7.8* 7.9* 8.8* 8.1*  HCT 29.5*  < > 22.3* 21.8* 22.7* 24.9* 24.2*  MCV 85.0  < > 81.7 79.9 81.9 83.6 85.2  PLT 307  < > 163 129* 132* 160 167  < > = values in this interval not displayed. Cardiac Enzymes:  Recent Labs Lab 11/19/13 1641 11/19/13 2157 11/20/13 0334 11/20/13 0820  CKTOTAL  --   --  200*  --   TROPONINI <0.30 <0.30  --  <0.30   CBG:  Recent Labs Lab 11/25/13 1712 11/25/13 1947 06/28/13 0012 06/28/13 0422 06/28/13 0717  GLUCAP 210* 157* 164* 181* 110*    Iron Studies: No results found for this basename: IRON, TIBC, TRANSFERRIN, FERRITIN,  in the last 72 hours Studies/Results: Ct Head Wo Contrast  11/24/2013   CLINICAL DATA:  Altered mental status  EXAM: CT HEAD WITHOUT CONTRAST  TECHNIQUE: Contiguous axial images were obtained from the base of the skull through the vertex without intravenous contrast.  COMPARISON:  None.  FINDINGS: No evidence of parenchymal hemorrhage or extra-axial fluid collection. No mass lesion, mass effect, or midline shift.  No CT evidence of acute infarction.  Cerebral volume is within normal limits.  No ventriculomegaly.  Partial opacification of the bilateral maxillary sinuses. Near complete opacification of the bilateral frontal and ethmoid sinuses. Mastoid air cells are clear.  Calcifications of the bilateral parotid glands.  No evidence of calvarial fracture.  IMPRESSION: No evidence of acute intracranial abnormality.  Paranasal sinus disease, as above.   Electronically Signed   By: Charline BillsSriyesh  Krishnan M.D.   On: 11/24/2013 18:18    Mr Brain Wo Contrast  11/25/2013   CLINICAL DATA:  Acute encephalopathy. Sepsis. Question lupus cerebritis.  EXAM: MRI HEAD WITHOUT CONTRAST  TECHNIQUE: Multiplanar, multiecho pulse sequences of the brain and surrounding structures were obtained without intravenous contrast.  COMPARISON:  CT of the head without contrast  FINDINGS: No acute infarct, hemorrhage, or mass lesion is present. The ventricles are of normal size. No significant extraaxial fluid collection is present. No significant [place] matter disease are focal cortical signal abnormality is present.  Flow is present in the major intracranial arteries. The globes and orbits are intact.  Fluid levels are present in the maxillary sinuses bilaterally. The ethmoid air cells are diffusely opacified. The frontal sinuses are opacified bilaterally. Mucosal thickening is present in the left sphenoid sinus. Bilateral mastoid effusions are noted. Patient is intubated with some fluid in the nasopharynx.  Multiple cystic lesions are present within the parotid glands bilaterally. CT scan demonstrated calcifications.  IMPRESSION: 1. Normal appearance of the brain without evidence for acute or focal encephalitis. 2. Diffuse sinus disease with restricted diffusion compatible with acute sinusitis. 3. Bilateral mastoid effusions, likely related to intubation. 4. Diffuse cystic changes of the parotid glands bilaterally. Given the calcifications on CT, this is most likely related to Sjogren's syndrome. Benign epithelial lesions of HIV are considered less likely given the calcifications.   Electronically Signed   By: Gennette Pachris  Mattern M.D.   On: 11/25/2013 17:10   [place] Chest  11/24/2013   CLINICAL DATA:  Respiratory failure. Pleural effusions noted on chest x-ray.  EXAM: CHEST ULTRASOUND  COMPARISON:  None.  FINDINGS: Ultrasound of the right chest demonstrates small effusion, however this is severely loculated with innumerable dense septations. There is essentially no free  fluid. Thoracentesis was not performed.  IMPRESSION: Severely loculated small to moderate sized right effusion.  Thoracentesis not performed  Read by: Brayton ElKevin Bruning PA-C   Electronically Signed   By: Gilmer MorJaime  Wagner D.O.   On: 11/24/2013 14:40   Dg Chest Port 1 View  11/25/2013   CLINICAL DATA:  Tachycardia.  EXAM: PORTABLE CHEST - 1 VIEW  COMPARISON:  11/25/2013  FINDINGS: Support devices are stable. Left central line tip remains coiled, possibly within the azygous vein. Cardiomegaly. Bilateral pleural effusions, right greater than left and bilateral lower lobe opacities,  also greater on the right are again noted. Findings are stable since prior study.  IMPRESSION: No significant change since prior study.   Electronically Signed   By: Charlett NoseKevin  Dover M.D.   On: 11/25/2013 12:49   Portable Chest Xray  11/25/2013   CLINICAL DATA:  Acute respiratory failure.  EXAM: PORTABLE CHEST - 1 VIEW  COMPARISON:  11/24/2013  FINDINGS: Interval placement of left central line. This her rolls in the right mediastinal and possibly is in the azygos vein. No pneumothorax. Right central line endotracheal tube per stable. NG tube coils in the stomach.  Cardiomegaly. Bilateral pleural effusions, right greater than left. The right effusion has enlarged since prior study. Bilateral perihilar and lower lobe opacities could reflect edema, atelectasis or infection and of slightly progressed.  IMPRESSION: Of bilateral effusions, enlarging on the right since prior study. Slight increase in bilateral perihilar and lower lobe airspace opacities.  Newly placed left central line curls in the mediastinum, possibly within the azygous vein.   Electronically Signed   By: Charlett NoseKevin  Dover M.D.   On: 11/25/2013 07:28   Dg Chest Port 1 View  11/24/2013   CLINICAL DATA:  Encounter for intubation.  EXAM: PORTABLE CHEST - 1 VIEW  COMPARISON:  One-view chest from the same day at 4:51 a.m.  FINDINGS: The endotracheal tube has been advanced and now  terminates just 1.7 cm above the carina. The NG tube courses off the inferior border of film. A right IJ sheath is in place. A left IJ line is stable. The heart is enlarged. Edema and bilateral pleural effusions are unchanged. Right greater than left basilar airspace disease is again noted.  IMPRESSION: 1. The endotracheal tube now terminates 1.7 cm above the carina and could be pulled back 1-2 cm for more optimal positioning. 2. Support apparatus is otherwise stable. 3. Persistent cardiomegaly with bilateral effusions and edema suggesting congestive heart failure. 4. Right greater than left basilar airspace opacities. While this may represent atelectasis, infection is also considered. These results will be called to the ordering clinician or representative by the Radiologist Assistant, and communication documented in the PACS or zVision Dashboard.   Electronically Signed   By: Gennette Pachris  Mattern M.D.   On: 11/24/2013 12:21   . antiseptic oral rinse  7 mL Mouth Rinse QID  . cefTRIAXone (ROCEPHIN)  IV  2 g Intravenous Q24H  . chlorhexidine  15 mL Mouth Rinse BID  . feeding supplement (PRO-STAT SUGAR FREE 64)  30 mL Oral TID WC  . feeding supplement (VITAL HIGH PROTEIN)  1,000 mL Per Tube Q24H  . hydrocortisone sod succinate (SOLU-CORTEF) inj  50 mg Intravenous Q6H  . insulin aspart  0-20 Units Subcutaneous 6 times per day  . insulin glargine  5 Units Subcutaneous Daily  . pantoprazole sodium  40 mg Per Tube Daily  . sodium bicarbonate      . tenecteplase  50 mg Intravenous Once    BMET    Component Value Date/Time   NA 136* 29-Jul-2013 0415   K 5.6* 29-Jul-2013 0415   CL 100 29-Jul-2013 0415   CO2 22 29-Jul-2013 0415   GLUCOSE 191* 29-Jul-2013 0415   BUN 43* 29-Jul-2013 0415   CREATININE 1.42* 29-Jul-2013 0415   CALCIUM 7.9* 29-Jul-2013 0415   GFRNONAA 46* 29-Jul-2013 0415   GFRAA 54* 29-Jul-2013 0415   CBC    Component Value Date/Time   WBC 25.3* 29-Jul-2013 0415   RBC 2.84* 29-Jul-2013 0415   HGB  8.1* 29-Jul-2013 0415  HCT 24.2* 11/01/2013 0415   PLT 167 10/28/2013 0415   MCV 85.2 11/15/2013 0415   MCH 28.5  0415   MCHC 33.5 10/30/2013 0415   RDW 16.0* 11/11/2013 0415   LYMPHSABS 1.5 11/08/2013 0415   MONOABS 0.5 11/18/2013 0415   EOSABS 0.3 11/21/2013 0415   BASOSABS 0.3* 11/10/2013 0415    Assessment/Plan:  1. AKI: Most likely toxic with NSAIDs and contrast. Good solute clearance, acid/base with CRRT with fluid even.  2. Hyperkalemia: dialsylate decreased yesterday Not exactly clear why.  Will ^ DFR and recheck 3. Pneumonia/gram positive sepsis: on abx. Concern for meningitis yesterday, LP with high opening pressure but only few WBCs, MRI largely normal. 4. VDRF per CCM, reintubated 10/30 5. Anemia 6. Nutrition TF  7. ? Autoimmune disease Pos ANA but neg dsDNA, low C4 but C3 normal so not as likely SLE but will need bx once stable if not improving. Positive RF titer likely from strep sepsis. 8. Thrombocytopenia P cont CRRT, keep fluids even, cont AB, TF  Tawni Carnes 10:18 AM I have seen and examined this patient and agree with the plan of care seen,examined,eval and discussed with resident.  Patient coded shortly after eval. .  Edis Huish L 10/29/2013, 10:25 AM

## 2013-11-27 NOTE — Progress Notes (Signed)
[id number] patient noted to have widening QRS complex with bradycardia rate at 45. Code called and CPR initiated. Nurse and NP at bedside. Code stopped 09230 following shock. Dr Levy Pupaobert Byrum at bedside and code stopped at this time.  Husband Jola Baptist(Moussa) outside of room. Dr. Delton CoombesByrum discussed and updated.   0942 HR asystole on monitor. No heart tones noted via auscultation. No pupillary response noted. No lungs sounds audible when ventilator stopped. Patient pronounced by Dr. Delton CoombesByrum. Chaplain at bedside and talking with family members.   [place] donor notified ruled out for donation.

## 2013-11-27 NOTE — Progress Notes (Signed)
Chaplain responded to code blue and stayed present for two hours. Chaplain present with family when MD announced pt death. Pt husband is Muslim and pt is [place]. Family has strong support system and is being coordinated by two sisters. Chaplain offered prayer, facilitated storytelling, facilitated information sharing between family and medical staff, offered emotional support, and offered grief support. Chaplain encouraged family to focus on present moment. Family did not know funeral arrangements at this time. Chaplain left Patient Placement Card with sister. Chaplain informed nurse of next of kin information (although husband absent at this time).  06-22-13 1000  Clinical Encounter Type  Visited With Family;Health care provider  Visit Type Death;Code  Spiritual Encounters  Spiritual Needs Grief support;Emotional;Prayer;Sacred text  Stress Factors  Family Stress Factors Loss;Loss of control;Major life changes;Family relationships  Geovannie Vilar, Loa SocksCourtney F, Chaplain Aug 13, 2013 10:57 AM

## 2013-11-27 NOTE — Code Documentation (Signed)
CODE BLUE NOTE  Patient Name: Courtney Cortez   MRN: [id number]   Date of Birth/ Sex: 11/16/1975 , female      Admission Date: 11/13/2013  Attending Provider: Oretha Milchakesh Alva V, MD  Primary Diagnosis: Pleuritic chest pain [R07.81] SOB (shortness of breath) [R06.02] Gastroesophageal reflux disease without esophagitis [K21.9] Elevated brain natriuretic peptide (BNP) level [R79.9] Benign essential HTN [I10]      Indication:  Pt was in her usual state of health until this AM, when she was noted to be a change in sensorium with acute rhythm change - initial rhythm was bradycardia that progressed to wide complex rhythm without a pulse. Code blue was subsequently called. At the time of arrival on scene, ACLS protocol was underway. Noted CVVHD filter clotting and pt admitted with bilateral pleural effusions and concern for pericarditis.    Technical Description:  - CPR performance duration:  Approximately 45 minutes   - Was defibrillation or cardioversion used?  Yes   - Was external pacer placed?  Pads placed, pacing not required.   - Was patient intubated pre/post CPR?  Intubated prior to CPR event.     Medications Administered: Y = Yes; Blank = No  Amiodarone    Atropine    Calcium  Y   Epinephrine  Y   Lidocaine    Magnesium    Norepinephrine  Y   Phenylephrine  Y   Sodium bicarbonate  Y   TNK  50 mg   Vasopressin  Y     Post CPR evaluation:  Final Status - Was patient successfully resuscitated ? No    Miscellaneous Information:  Time of death:  0939 AM      Family Notified?  Yes. Updated in full at bedside. Family present for end of code process.         Dr. Delton CoombesByrum at bedside during event. Pharmacy, Code Team and Nursing Staff. See CPR record for details.    Canary BrimBrandi Ollis, NP-C  [facility]  Pgr: [phone] or [phone]  Levy Pupaobert Mozel Burdett, MD, PhD 11-03-2013, 6:17 PM [facility] [phone] or if no answer [phone]

## 2013-11-27 DEATH — deceased

## 2013-11-28 ENCOUNTER — Encounter (HOSPITAL_COMMUNITY): Payer: Self-pay | Admitting: Radiology

## 2013-11-28 LAB — COMPLEMENT, TOTAL: Compl, Total (CH50): >60 U/mL — ABNORMAL HIGH (ref 31–60)

## 2013-11-28 LAB — PATHOLOGIST SMEAR REVIEW

## 2013-11-29 LAB — CSF CULTURE

## 2013-11-29 LAB — CSF CULTURE W GRAM STAIN: Culture: NO GROWTH

## 2013-11-30 NOTE — Progress Notes (Signed)
CARE MANAGEMENT NOTE 11/30/2013  Patient:  Baldemar LenisWHITE,Sha   Account Number:  [id number]  Date Initiated:  11/21/2013  Documentation initiated by:  Junius CreamerWELL,DEBBIE  Subjective/Objective Assessment:   adm w resp failure-vent     Action/Plan:   lives w fam, pcp dr Bruna Potterblount   Anticipated DC Date:  11/24/2013   Anticipated DC Plan:  EXPIRED         Choice offered to / List presented to:             Status of service:  Completed, signed off Medicare Important Message given?   (If response is "NO", the following Medicare IM given date fields will be blank) Date Medicare IM given:   Medicare IM given by:   Date Additional Medicare IM given:   Additional Medicare IM given by:    Discharge Disposition:  EXPIRED  Per UR Regulation:  Reviewed for med. necessity/level of care/duration of stay  If discussed at Long Length of Stay Meetings, dates discussed:   11/22/2013  11/24/2013    Comments:

## 2013-12-01 NOTE — Discharge Summary (Signed)
NAMEFREIDA, NEBEL NO.:  [id number]  MEDICAL RECORD NO.:  [id number]  LOCATION:                                 FACILITY:  PHYSICIAN:  Raylene Miyamoto, MD DATE OF BIRTH:  1976-01-02  DATE OF ADMISSION:  11/19/2013 DATE OF DISCHARGE:  12/14/13                              DISCHARGE SUMMARY   DEATH SUMMARY  This is a 38 year old female with arthritis who presented to [facility] on November 17, 2013, with chest pain and shortness of breath, thought secondary to pericarditis, developed bilateral pleural effusions, markedly tachypneic, and tachycardic.  On November 20, 2013, evaluated by Critical Care.  At that time, blood cultures subsequently showed pneumococcus.  The patient had strep pneumo bacteremia and strep in the sputum.  Significant events during hospitalization:  On November 17, 2013, she was admitted to the hospital.  On November 20, 2013, intubated. On November 22, 2013, acute renal failure requiring CRRT.  On November 24, 2013, patient was extubated and re-intubated promptly secondary to encephalopathy, worsening clinical status.  Studies have included PE protocol, CAT scan of the chest with no PE with bilateral atelectasis. On November 17, 2013, CT scan, no PE but worsened bilateral airspace disease.  On November 17, 2013, echo showed trivial pericardial effusion. No evidence of tamponade.  On November 18, 2013, had HIV which was negative.  On November 19, 2013, had an echocardiogram, which revealed pericardial effusion.  No evidence of tamponade.  On November 20, 2013, the patient was slightly acidotic.  On November 20, 2013, patient had ESR and other rheumatologic laboratory work that showed significance for autoimmune disease.  On November 21, 2013, patient underwent renal ultrasound with increased echogenicity with no hydronephrosis.  On November 21, 2013, the patient with bilateral effusions but too small to tap.  On November 22, 2013, worsening  effusions on chest film.  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 Cultures as noted was positive for rhinovirus and viral panel as well as strep pneumo bacteremia and from the sputum.  The patient was hypothermic, most likely secondary to CVVHD, had poor urine output.  The concern was worsening neurologic status, and the patient underwent lumbar puncture to ensure that this was not association of strep pneumo meningitis from the beginning.  Lumbar puncture was unimpressive.  The patient also underwent MRI of the brain to make sure this was not lupus cerebritis, which did not show any cerebritis and then eventually the patient unfortunately had a sudden cardiac arrest on 2013/12/14, for which she did not survive.  FINAL DIAGNOSES UPON DEATH: 1. Strep pneumo bacteremia. 2. Strep pneumo pneumonia. 3. Rhinovirus pneumonitis, likely. 4. Acute renal failure. 5. Autoimmune disease, nonspecified, to rule out complement deficiency     giving rise to an increased risk of infection. 6. Acute respiratory failure.     Raylene Miyamoto, MD     DJF/MEDQ  D:  11/30/2013  T:  12/01/2013  Job:  [id number]

## 2013-12-02 LAB — CULTURE, BLOOD (ROUTINE X 2)
CULTURE: NO GROWTH
Culture: NO GROWTH

## 2013-12-24 LAB — FUNGUS CULTURE W SMEAR: Fungal Smear: NONE SEEN
